# Patient Record
Sex: Male | Born: 1940 | Race: White | Hispanic: No | Smoking: Former smoker
Health system: Southern US, Community
[De-identification: ages and names within clinical notes are randomized; demographics above are authoritative.]

## PROBLEM LIST (undated history)

## (undated) DIAGNOSIS — I2510 Atherosclerotic heart disease of native coronary artery without angina pectoris: Secondary | ICD-10-CM

## (undated) DIAGNOSIS — J449 Chronic obstructive pulmonary disease, unspecified: Secondary | ICD-10-CM

## (undated) DIAGNOSIS — E119 Type 2 diabetes mellitus without complications: Secondary | ICD-10-CM

## (undated) DIAGNOSIS — I4891 Unspecified atrial fibrillation: Secondary | ICD-10-CM

---

## 2007-05-30 ENCOUNTER — Encounter: Payer: Self-pay | Admitting: Physical Medicine and Rehabilitation

## 2007-06-17 ENCOUNTER — Encounter: Payer: Self-pay | Admitting: Physical Medicine and Rehabilitation

## 2012-11-12 ENCOUNTER — Inpatient Hospital Stay: Payer: Self-pay | Admitting: Internal Medicine

## 2012-11-12 LAB — CBC WITH DIFFERENTIAL/PLATELET
HCT: 45.8 % (ref 40.0–52.0)
HGB: 15.7 g/dL (ref 13.0–18.0)
Lymphocyte #: 1.6 10*3/uL (ref 1.0–3.6)
MCV: 89 fL (ref 80–100)
Monocyte #: 0.8 x10 3/mm (ref 0.2–1.0)
Monocyte %: 6.9 %
Neutrophil #: 8.4 10*3/uL — ABNORMAL HIGH (ref 1.4–6.5)
Neutrophil %: 76.7 %
Platelet: 208 10*3/uL (ref 150–440)
RBC: 5.13 10*6/uL (ref 4.40–5.90)
WBC: 11 10*3/uL — ABNORMAL HIGH (ref 3.8–10.6)

## 2012-11-12 LAB — URINALYSIS, COMPLETE
Bilirubin,UR: NEGATIVE
Glucose,UR: >=500 mg/dL (ref 0–75)
Leukocyte Esterase: NEGATIVE
Ph: 5 (ref 4.5–8.0)
Protein: 30
RBC,UR: 1 /HPF (ref 0–5)
Specific Gravity: 1.03 (ref 1.003–1.030)
Squamous Epithelial: NONE SEEN
WBC UR: 1 /HPF (ref 0–5)

## 2012-11-12 LAB — BASIC METABOLIC PANEL
Anion Gap: 6 — ABNORMAL LOW (ref 7–16)
Calcium, Total: 9.6 mg/dL (ref 8.5–10.1)
Chloride: 99 mmol/L (ref 98–107)
Co2: 30 mmol/L (ref 21–32)
Creatinine: 0.77 mg/dL (ref 0.60–1.30)
EGFR (African American): >60
Osmolality: 278 (ref 275–301)
Potassium: 4 mmol/L (ref 3.5–5.1)
Sodium: 135 mmol/L — ABNORMAL LOW (ref 136–145)

## 2012-11-12 LAB — PROTIME-INR: INR: 1

## 2012-11-13 LAB — CBC WITH DIFFERENTIAL/PLATELET
Basophil #: 0.1 10*3/uL (ref 0.0–0.1)
Basophil %: 0.6 %
Eosinophil #: 0 10*3/uL (ref 0.0–0.7)
Eosinophil %: 0.3 %
HCT: 43.4 % (ref 40.0–52.0)
HGB: 14.5 g/dL (ref 13.0–18.0)
Lymphocyte #: 2.3 10*3/uL (ref 1.0–3.6)
Lymphocyte %: 15.1 %
MCV: 90 fL (ref 80–100)
Monocyte #: 1.5 x10 3/mm — ABNORMAL HIGH (ref 0.2–1.0)
Monocyte %: 10.2 %
Neutrophil %: 73.8 %
RBC: 4.84 10*6/uL (ref 4.40–5.90)
RDW: 14 % (ref 11.5–14.5)

## 2012-11-13 LAB — BASIC METABOLIC PANEL
Anion Gap: 6 — ABNORMAL LOW (ref 7–16)
Chloride: 100 mmol/L (ref 98–107)
Co2: 29 mmol/L (ref 21–32)
Creatinine: 0.73 mg/dL (ref 0.60–1.30)
Potassium: 3.9 mmol/L (ref 3.5–5.1)
Sodium: 135 mmol/L — ABNORMAL LOW (ref 136–145)

## 2012-11-14 LAB — CBC WITH DIFFERENTIAL/PLATELET
Eosinophil %: 0.1 %
HCT: 38.5 % — ABNORMAL LOW (ref 40.0–52.0)
HGB: 13.1 g/dL (ref 13.0–18.0)
Lymphocyte #: 2.1 10*3/uL (ref 1.0–3.6)
Lymphocyte %: 12.4 %
MCH: 30.5 pg (ref 26.0–34.0)
MCHC: 34 g/dL (ref 32.0–36.0)
MCV: 90 fL (ref 80–100)
Neutrophil #: 12.5 10*3/uL — ABNORMAL HIGH (ref 1.4–6.5)
Platelet: 183 10*3/uL (ref 150–440)

## 2012-11-14 LAB — BASIC METABOLIC PANEL
Anion Gap: 4 — ABNORMAL LOW (ref 7–16)
BUN: 15 mg/dL (ref 7–18)
Calcium, Total: 8.8 mg/dL (ref 8.5–10.1)
Chloride: 101 mmol/L (ref 98–107)
EGFR (Non-African Amer.): >60
Osmolality: 276 (ref 275–301)
Potassium: 4.3 mmol/L (ref 3.5–5.1)
Sodium: 135 mmol/L — ABNORMAL LOW (ref 136–145)

## 2012-11-21 ENCOUNTER — Other Ambulatory Visit: Payer: Self-pay | Admitting: Family Medicine

## 2012-11-21 LAB — CBC WITH DIFFERENTIAL/PLATELET
Basophil #: 0.1 10*3/uL (ref 0.0–0.1)
Eosinophil #: 0.1 10*3/uL (ref 0.0–0.7)
Eosinophil %: 0.7 %
Lymphocyte %: 7.1 %
MCH: 30 pg (ref 26.0–34.0)
MCHC: 33.8 g/dL (ref 32.0–36.0)
Monocyte #: 1.5 x10 3/mm — ABNORMAL HIGH (ref 0.2–1.0)
Platelet: 272 10*3/uL (ref 150–440)
RBC: 3.77 10*6/uL — ABNORMAL LOW (ref 4.40–5.90)
RDW: 13.9 % (ref 11.5–14.5)
WBC: 21.9 10*3/uL — ABNORMAL HIGH (ref 3.8–10.6)

## 2012-11-21 LAB — URINALYSIS, COMPLETE
Blood: NEGATIVE
Glucose,UR: >=500 mg/dL (ref 0–75)
Nitrite: NEGATIVE
Ph: 5 (ref 4.5–8.0)
Protein: NEGATIVE
RBC,UR: 1 /HPF (ref 0–5)
Specific Gravity: 1.035 (ref 1.003–1.030)
Squamous Epithelial: <1
WBC UR: 2 /HPF (ref 0–5)

## 2012-11-21 LAB — COMPREHENSIVE METABOLIC PANEL
Albumin: 2.8 g/dL — ABNORMAL LOW (ref 3.4–5.0)
Anion Gap: 7 (ref 7–16)
BUN: 18 mg/dL (ref 7–18)
Bilirubin,Total: 0.4 mg/dL (ref 0.2–1.0)
Chloride: 93 mmol/L — ABNORMAL LOW (ref 98–107)
Co2: 30 mmol/L (ref 21–32)
EGFR (African American): >60
EGFR (Non-African Amer.): >60
Glucose: 189 mg/dL — ABNORMAL HIGH (ref 65–99)

## 2012-11-23 LAB — URINE CULTURE

## 2012-12-03 ENCOUNTER — Other Ambulatory Visit: Payer: Self-pay

## 2012-12-03 LAB — URINALYSIS, COMPLETE
Bacteria: NONE SEEN
Blood: NEGATIVE
Glucose,UR: >=500 mg/dL (ref 0–75)
Ketone: NEGATIVE
RBC,UR: 2 /HPF (ref 0–5)
Specific Gravity: 1.039 (ref 1.003–1.030)
Squamous Epithelial: <1
WBC UR: 3 /HPF (ref 0–5)

## 2012-12-03 LAB — CBC WITH DIFFERENTIAL/PLATELET
Basophil %: 0.9 %
Eosinophil #: 0.2 10*3/uL (ref 0.0–0.7)
Eosinophil %: 1 %
HCT: 34.6 % — ABNORMAL LOW (ref 40.0–52.0)
HGB: 11.7 g/dL — ABNORMAL LOW (ref 13.0–18.0)
MCH: 29.4 pg (ref 26.0–34.0)
MCHC: 33.7 g/dL (ref 32.0–36.0)
Monocyte %: 4.7 %
Neutrophil #: 17.8 10*3/uL — ABNORMAL HIGH (ref 1.4–6.5)
Neutrophil %: 83.6 %
RDW: 14.3 % (ref 11.5–14.5)
WBC: 21.3 10*3/uL — ABNORMAL HIGH (ref 3.8–10.6)

## 2012-12-03 LAB — COMPREHENSIVE METABOLIC PANEL
Albumin: 2.4 g/dL — ABNORMAL LOW (ref 3.4–5.0)
Alkaline Phosphatase: 140 U/L — ABNORMAL HIGH (ref 50–136)
BUN: 14 mg/dL (ref 7–18)
Calcium, Total: 8.3 mg/dL — ABNORMAL LOW (ref 8.5–10.1)
Chloride: 97 mmol/L — ABNORMAL LOW (ref 98–107)
Co2: 30 mmol/L (ref 21–32)
Creatinine: 0.64 mg/dL (ref 0.60–1.30)
EGFR (African American): >60
EGFR (Non-African Amer.): >60
Glucose: 210 mg/dL — ABNORMAL HIGH (ref 65–99)
Potassium: 4.4 mmol/L (ref 3.5–5.1)
SGPT (ALT): 20 U/L (ref 12–78)

## 2012-12-03 LAB — SEDIMENTATION RATE: Erythrocyte Sed Rate: 5 mm/hr (ref 0–20)

## 2012-12-05 ENCOUNTER — Other Ambulatory Visit: Payer: Self-pay | Admitting: Family Medicine

## 2012-12-05 LAB — URINE CULTURE

## 2012-12-17 ENCOUNTER — Other Ambulatory Visit: Payer: Self-pay | Admitting: Family Medicine

## 2012-12-17 LAB — CLOSTRIDIUM DIFFICILE(ARMC)

## 2013-05-14 ENCOUNTER — Ambulatory Visit: Payer: Self-pay | Admitting: Orthopedic Surgery

## 2013-06-02 ENCOUNTER — Ambulatory Visit: Payer: Self-pay | Admitting: Orthopedic Surgery

## 2014-05-08 NOTE — Op Note (Signed)
PATIENT NAME:  Craig Nash, Craig Nash MR#:  [id number] DATE OF BIRTH:  [birth year]  DATE OF PROCEDURE:  11/13/2012  PREOPERATIVE DIAGNOSIS:  Right nondisplaced intertrochanteric hip fracture.   POSTOPERATIVE DIAGNOSIS:  Right nondisplaced intertrochanteric hip fracture.  PROCEDURE:  Open reduction and internal fixation of right intertrochanteric hip fracture.   ANESTHESIA:  Spinal.   SURGEON:  Juanell FairlyKevin Dung Prien, M.D.   ASSISTANT:  Arther AbbottJenni Burnett, [employer] PA student.   ESTIMATED BLOOD LOSS:  100 mL.   COMPLICATIONS:  None.   IMPLANTS:  Synthes 4-holed DHS plate with 045100 mm lag screw and four bicortical screws.   INDICATIONS FOR PROCEDURE:  The patient is a 74 year old male who sustained a mechanical fall at home.  He was unable to stand or bear weight following this injury.  I have recommended plate fixation for this fracture.  I reviewed the risks and benefits of surgery with the patient.  He understands the risks and wished to proceed.   PROCEDURE NOTE:  The patient was marked with the word yes over the right hip according to the hospital's right site protocol.  He was brought to the Operating Room where he underwent placement of a spinal anesthetic by the anesthesia service.  He was then placed supine on the fracture table.  His right leg was placed in a legholder and his left leg was placed in a hemi- lithotomy position.  All bony prominences were adequately padded, since this was a nondisplaced fracture the patient did not require any traction or significant manipulation.   The proposed incision was drawn out with a surgical marker based upon C-arm images taken prior to the incision.  A lateral incision was made based over just inferior to the greater trochanter along the lateral thigh.  The initial incision was made with a #10 blade.  The subcutaneous tissues were then dissected with electrocautery.  All bleeding vessels were cauterized during the exposure.  The fascia lata was then identified and  cleared of overlying adipose tissue with a Art therapistKey elevator.  A deep #10 blade was used to sharply incise the fascia lata.  The vastus lateralis muscle was then bluntly split in line with its fibers to reveal the lateral cortex of the femur.  A 130 degree drill guide was placed along the lateral femur.  A drill pin was advanced across the fracture site and into the femoral head.  A tip of apex distance of less than 25 mm was achieved and the drill pin placement was confirmed on AP and lateral C-arm images.  A depth gauge was used to measure the lag screw length which was 100 mm.   The cannulated lag screw drill was then placed over the guidepin and advanced to a depth of 100 mm.  The cannulated tap was then advanced into the femoral head and backed out.  This was performed by hand.  A 130 degree 4-hole Synthes DHS plate with 409100 mm lag screw construct was then assembled on the back table.  The lag screw was advanced using a centralizer to a depth of 100 mm.  Its position was confirmed on AP and lateral C-arm images.  The centralizer was then removed.  The 4-hole plate was then advanced into position along the lateral femur.  Again, the position of the lag screw and plate was confirmed on AP and lateral C-arm images.  Four bicortical screws were then placed through the Synthes plate to stabilize it to the lateral femur.  The depth of the bicortical screws  was measured using a depth gauge.  Once all four screws were in position, final images were taken in both the AP and lateral plane of the fixation construct.  The wounds were then copiously irrigated with GU impregnated saline.  The vastus lateralis muscle was reapproximated.  The fascia lata was closed with interrupted 0 Vicryl suture in a figure-of-eight pattern.  The fascia lata was then copiously irrigated again with bulb irrigation.  The subcutaneous tissue was closed with 2-0 Vicryl and the skin approximated by staples.  A dry sterile dressing was applied.  The  patient was then awoken and transferred to the hospital bed in stable condition.  I was scrubbed and present for the entire case and all sharp and instrument counts were correct at the conclusion of the case.  I spoke with the patient's girlfriend in the postop consultation room to let her know that the patient was stable in recovery and the case had gone without complication.      ____________________________ Kathreen Devoid, MD klk:ea D: 11/13/2012 23:16:39 ET T: 11/13/2012 23:36:10 ET JOB#: [id number]  cc: Kathreen Devoid, MD, <Dictator> Kathreen Devoid MD ELECTRONICALLY SIGNED 12/09/2012 19:22

## 2014-05-08 NOTE — H&P (Signed)
PATIENT NAME:  Craig Nash, Craig Nash MR#:  [id number] DATE OF BIRTH:  06-05-1940  DATE OF ADMISSION:  11/12/2012  PRIMARY CARE PROVIDER: At [facility]  EMERGENCY DEPARTMENT REFERRING PHYSICIAN: Dr. Ethelda Chick.  REASON FOR ADMISSION: Hip fracture.   HISTORY OF PRESENT ILLNESS: The patient is a 74 year old Caucasian male with history of chronic respiratory failure on 2 to 3 liters of oxygen and has a history of COPD, hypertension, previous history of CVA, GERD, diabetes, who has chronic unsteadiness, who was trying to walk, and then he tried to step outside the door and lost balance and fell. Now has a right hip fracture. The patient has chronic respiratory failure, which is unchanged. He uses a O2 at all times 2 to 3 liters. This has not changed. He has not had any fevers or chills. Has not been coughing or wheezing. He also has a history of coronary artery disease and denies any recent chest pain or palpitations. Denies any lower extremity swelling or any nocturnal dyspnea. He does have very poor circulation with peripheral vascular disease. 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 According to his wife his diabetes is under poor control. He otherwise denies any abdominal pain, nausea, vomiting, diarrhea, or any urinary symptoms.   PAST MEDICAL HISTORY: Significant for:  1. COPD on 2 to 3 liters of oxygen at all times.  2. Hypertension.  3. 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  4. GERD. 5. Hyperlipidemia.  6. Diabetes type 2 insulin requiring.  7. 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   PAST SURGICAL HISTORY: Status post ERCP, CABG, appendectomy, hernia repair.   ALLERGIES: None.   CURRENT MEDICATIONS: He is on aspirin 81 one tab p.o. daily, acetaminophen hydrocodone 325/7.5 one tab p.o. q.8 p.r.n., atorvastatin 80 at bedtime, Colace 100, 1 tab p.o. b.i.d., Humalog 8 units t.i.d. prior to each meal, Lantus  55 units at bedtime, Reglan 10 mg 1 tab 4 times a day prior to each meal and metoprolol 12.5 mg 1 tab p.o. b.i.d., omeprazole 20 daily, paroxetine 40 daily, Ramipril 5 mg 1 tab p.o. b.i.d., Spiriva inhalation 18 mcg daily.   SOCIAL HISTORY: Does not smoke. Does not drink. No drugs. Lives with his wife.   FAMILY HISTORY: Positive for hypertension.   REVIEW OF SYSTEMS:  CONSTITUTIONAL: Denies any fever, has chronic fatigue, weakness. Complains of pain in his leg. No weight loss. No weight gain.  EYES: No blurred or double vision. No pain. No redness. No inflammation. Has history of cataracts that have been operated on.  ENT: No tinnitus. No ear pain. No hearing loss. No seasonal or year-round allergies. No epistaxis. No nasal discharge. No snoring. No postnasal drip. No sinus pain. No difficulty swallowing.  RESPIRATORY: Denies any cough, wheezing, hemoptysis. Has chronic dyspnea. Has COPD.  CARDIOVASCULAR: Denies any chest pain, orthopnea, edema. No arrhythmia. Has dyspnea on exertion. No palpitations. No syncope.  GASTROINTESTINAL: No nausea, vomiting, diarrhea. No abdominal pain. No hematemesis. No melena. No ulcer. No GERD. No IBS. No jaundice.  GENITOURINARY: Denies any dysuria, hematuria, renal calculus or frequency.  ENDOCRINE: Denies any polyuria, nocturia or thyroid problems.  HEMATOLOGIC AND LYMPHATIC: Denies any major bruisability or bleeding.  SKIN: No acne. No rash. No changes in mole, hair or skin.  MUSCULOSKELETAL: Complains of pain in his leg. No gout.  NEUROLOGICAL: History of previous CVA. No seizures.  PSYCHIATRIC: No anxiety. No insomnia. No  ADD.   PHYSICAL EXAMINATION: VITAL SIGNS: Temperature 99.2, pulse 93, respirations 19, blood pressure 145/83, 02 90 on room air.  GENERAL: The patient is a chronically ill-appearing male. Currently not in any acute distress.  HEENT: Head atraumatic, normocephalic. Pupils equally round, reactive to light and accommodation. There is no  conjunctival pallor. No scleral icterus. Extraocular movements intact. Nasal exam shows no nasal lesions or drainage.  EARS: There is no drainage or external lesions.  MOUTH: No exudates. No lesions.   NECK: Supple and symmetric. No masses. Thyroid midline, not enlarged. No JVD.  CARDIOVASCULAR: Regular rate and rhythm. No murmurs, gallops, clicks or heaves.  RESPIRATORY: Good respiratory effort. Diminished breath sounds bilaterally without any rales, rhonchi, wheezing.  ABDOMEN: Soft, nontender, nondistended. Positive bowel sounds x 4. No hepatosplenomegaly.  GENITOURINARY: Deferred.  MUSCULOSKELETAL: There is no erythema or swelling.  SKIN: No rash.  LYMPHATICS: No lymph nodes palpable.  VASCULAR: Diminished DP, PT pulses.  NEUROLOGICAL: Cranial nerves II through XII grossly appear intact. The patient has generalized weakness.   EVALUATIONS: EKG shows possible old anterior infarct. No ST-T wave changes. WBC 11.0, hemoglobin 15.7, platelet count 208. The rest of his blood work is pending.   ASSESSMENT AND PLAN: The patient is a 74 year old white male with multiple medical problems status post fall with hip fracture.  1. Hip fracture. In light of his significant respiratory failure and coronary artery disease the patient is considered, moderate to high risk of surgical complications. However, he will need his hip operated on, otherwise he will be bedridden for a prolonged time, which will likely lead to worse outcome. At this time recommend no further work-up. It is okay to proceed to the OR. The patient to discuss further risks and benefits with Ortho. We will monitor him on telemetry and Ortho floor.  2. Chronic obstructive pulmonary disease without evidence of acute chronic obstructive pulmonary disease exacerbation. He does have chronic respiratory failure. We will continue 02 at 2 to 3 liters as taking at home. We will continue Spiriva. I will add Advair to his current regimen as well as  p.r.n. nebulizers.  3. Diabetes on Lantus high-dose with premeal insulin. At this time, we will place him on lower dose Levemir since Lantus is not carried at the hospital. We will monitor his blood sugar, adjust his Levemir dose as per his sugars. Once his surgery is complete he will likely need to have his premeal insulin restarted. 4. Hypertension. We will hold his ramipril but continue metoprolol.  5. Coronary artery disease. Hold aspirin and resume as soon as possible. Continue metoprolol.  6. Hyperlipidemia. Continue atorvastatin.  7. Miscellaneous: Recommend incentive spirometry postoperative as well as early ambulation and deep vein thrombosis prophylaxis.   TIME SPENT: 50 minutes spent on this patient.    ____________________________ Lacie ScottsShreyang H. Allena KatzPatel, MD shp:sg D: 11/12/2012 18:17:00 ET T: 11/12/2012 18:24:43 ET JOB#: [id number]  cc: Madelynn Malson H. Allena KatzPatel, MD, <Dictator> Charise CarwinSHREYANG H Geniva Lohnes MD ELECTRONICALLY SIGNED 11/15/2012 8:32

## 2014-05-08 NOTE — Discharge Summary (Signed)
PATIENT NAME:  Craig Nash, Craig Nash MR#:  [id number] DATE OF BIRTH:  01-10-1941  DATE OF ADMISSION:  11/12/2012 DATE OF DISCHARGE:  11/16/2012   PRESENTING COMPLAINT: Fall with right hip pain.  DISCHARGE DIAGNOSES: 1. Right intertrochanteric hip fracture status post open reduction and internal fixation by Dr.  Martha ClanKrasinski on October 29th.  2. Chronic obstructive pulmonary disease, on 3 liters nasal cannula oxygen.  3. Type 2 diabetes.  4. Hyperlipidemia.  5. Hypertension.  6. Coronary artery disease.   CONDITION ON DISCHARGE: Fair. Vitals stable. Saturations are 94% on 2.5 liters. Blood pressure 113/75, pulse is 98.  CODE STATUS: Full code.   LABORATORY DATA AT DISCHARGE: Glucose 220. H and H is 13.1 and 38.5, white count is 16.7. BUN is 15, creatinine is 0.7, sodium is 135, potassium 4.3. UA negative for UTI.   IMAGING: Chest x-ray on admission: Borderline cardiomegaly, prior CABG. Right hip shows right intertrochanteric hip fracture.   CONSULTATION: Orthopedic consultation by Kathreen DevoidKevin L. Krasinski, MD  FOLLOWUP: With Dr. Martha ClanKrasinski in 2 weeks.   DISCHARGE INSTRUCTIONS: Physical therapy. Oxygen 2 liters per minute continuous to keep sats greater than 92%. Physical therapy, speech therapy to follow.   DIET: 2 grams sodium, 1800 calorie ADA diet.   MEDICATIONS:  1. Paxil 40 mg daily.  2. Metoclopramide 10 mg q.i.d.  3. Atorvastatin 80 mg at bedtime.  4. Lopressor 12.5 b.i.d.  5. Spiriva 1 capsule inhalation daily.  6. Sliding scale insulin.  7. Insulin detemir 30 units q.24.  8. Ramipril 5 mg b.i.d.  9. Cyclobenzaprine 5 mg q.8 p.r.n.  10. Aspirin 325 mg p.o. daily.  11. Bisacodyl 10 mg rectal at bedtime p.r.n.  12. Norco 5/325 mg 1 to 2 q.4-6 p.r.n.  13. Omeprazole 20 mg daily.  14. Fosamax 70 mg p.o. weekly.  15. Tylenol 650 q.4 p.r.n.  16. Calcium carbonate with vitamin D 1 tablet b.i.d.  17. Docusate 240 mg at bedtime.  18. Ferrous sulfate 325 b.i.d.   BRIEF SUMMARY OF  HOSPITAL COURSE: Craig Nash is a 74 year old Caucasian gentleman with history of COPD, on home oxygen, hypertension, diabetes and CAD, who comes to the Emergency Room after he had sustained a mechanical fall with right hip fracture. He was admitted with:   1. Acute right intertrochanteric hip fracture. He was seen by Dr. Martha ClanKrasinski. He was medically cleared and underwent open reduction and internal fixation on the 29th of October. Postoperatively, the patient did well. He tolerated surgery well. His incentive spirometry was continued along with his oxygen and nebulizers and inhalers. The patient will get aspirin 325 mg p.o. daily for deep vein thrombosis prophylaxis per Dr. Reita Chardhris Smith, who was covering for Dr. Martha ClanKrasinski over the weekend. He received Lovenox while he was in the hospital.  2. Chronic obstructive pulmonary disease, on home oxygen, without acute flare. Continue Spiriva and p.r.n. nebulizers.  3. Type 2 diabetes, on insulin detemir, with premeal insulin. He is going to be discharged with Levemir.  4. Hypertension, on ramipril and metoprolol.  5. Coronary artery disease. Resume aspirin.  6. Hyperlipidemia. Continue atorvastatin.  7. Deep vein thrombosis prophylaxis. Was on 30 mg Lovenox subcutaneous b.i.d.; however, per Dr. Reita Chardhris Smith, give aspirin 325 daily at discharge.  8. Speech therapy to see the patient to evaluate swallowing evaluation prior to discharge.  9. Hospital stay otherwise remained stable.   CODE STATUS: The patient remained a full code.   TIME SPENT: 40 minutes.   ____________________________ Wylie HailSona A. Allena KatzPatel, MD sap:lb  D: 11/16/2012 09:10:28 ET T: 11/16/2012 09:32:11 ET JOB#: [id number]  cc: Hiram Mciver A. Allena Katz, MD, <Dictator> Kathreen Devoid, MD Willow Ora MD ELECTRONICALLY SIGNED 11/21/2012 13:27

## 2014-05-08 NOTE — Consult Note (Signed)
Brief Consult Note: Diagnosis: Right non-displaced intertrochanteric hip fracture.   Patient was seen by consultant.   Consult note dictated.   Recommend to proceed with surgery or procedure.   Orders entered.   Comments: Patient is scheduled for surgery later today.  I have reviewed all radiographs and lab tests as well as the admission H&P from Dr. Allena KatzPatel, hospitalist.  I have reviewed the risks and benefits of surgery and blood transfusion with the patient.  He has agreed to surgery.  I called his daughter but was unable to reach her.  Electronic Signatures: Juanell FairlyKrasinski, Clairessa Boulet (MD)  (Signed 29-Oct-14 09:15)  Authored: Brief Consult Note   Last Updated: 29-Oct-14 09:15 by Juanell FairlyKrasinski, Shenice Dolder (MD)

## 2014-05-08 NOTE — Consult Note (Signed)
PATIENT NAME:  Craig Nash, Craig Nash MR#:  [id number] DATE OF BIRTH:  March 14, 1940  DATE OF CONSULTATION:  11/13/2012  REFERRING PHYSICIAN:   CONSULTING PHYSICIAN:  Kathreen Devoid, MD  REASON FOR CONSULTATION: Right intertrochanteric hip fracture.   HISTORY OF PRESENT ILLNESS: Craig Nash is a 74 year old male with chronic COPD with a 2 to 3 liter home O2 requirement as well as a history of CVA, GERD and diabetes. The patient has chronic issues with balance and fell trying to walk outside of his house. He landed on his right side and was unable to stand or ambulate following this injury. He was brought to [facility] where he was diagnosed with a nondisplaced intertrochanteric hip fracture. He was admitted to the hospitalist service for preop clearance. I am consulted for management of the intertrochanteric hip fracture.   PAST MEDICAL HISTORY: Includes COPD, hypertension, previous CVA with left-sided weakness and an unsteady gait, GERD, hyperlipidemia, type 2 diabetes requiring insulin and coronary artery disease status post CABG 14 years ago.   PAST SURGICAL HISTORY: Includes ERCP, CABG, appendectomy and a hernia repair.   ALLERGIES TO MEDICATIONS: None.   CURRENT MEDICATIONS: Aspirin 81 mg daily, Norco 1 tablet p.o. q.8 hours p.r.n., atorvastatin 80 mg at bedtime, Colace 100 mg b.i.d., Humalog 8 units t.i.d. prior to each meal, Lantus 55 units at bedtime, Reglan 10 mg 1 tab q.i.d., metoprolol 12.5 mg 1 tablet b.i.d., omeprazole 20 mg daily, paroxetine 40 mg daily, ramipril 5 mg b.i.d. and Spiriva inhaler 18 mcg daily.   SOCIAL HISTORY: The patient does not smoke, use drugs or drink alcohol. He lives with his girlfriend.   PHYSICAL EXAMINATION: RIGHT HIP: The patient's skin is intact. He has no erythema, ecchymosis or significant swelling. His thigh compartments are soft and compressible. He has intact sensation to light touch throughout the right lower extremity with palpable pedal  pulses. He has intact motor function throughout the right lower extremity as well.   RADIOLOGY: X-ray films of the pelvis and right hip were reviewed. This demonstrates a fracture of the right hip. This was nondisplaced.   ASSESSMENT: Nondisplaced right intertrochanteric hip fracture.   PLAN: I explained to Craig Nash the nature of his fracture. He understands that he will be unable to get out of bed, stand or walk with this injury if he does not have this surgically fixed. I am recommending an open reduction and internal fixation. I reviewed the details of this operation along with the risks and benefits. He understands the risks include, but are not limited to, infection, bleeding, nerve or blood vessel injury, malunion, nonunion, failure of the hardware, screw cut out, persistent right hip pain, change in leg lengths or change in lower extremity rotation and the need for further surgery. Medical complications include, but are not limited to, DVT and pulmonary embolism, myocardial infarction, stroke, pneumonia, respiratory failure and death. The patient understood these risks and wished to proceed with surgery. Consent was signed this morning at his bedside along with a consent for blood transfusion if necessary. He understands the risks of a transfusion include possible disease transmission including hepatitis C, HIV or bacterial infection.   I have reviewed all of the patient's labs and radiographic studies in preparation for his case.   The patient's right hip was signed with the word yes according to the hospital's right side protocol. He will remain n.p.o. today and is scheduled for surgery later this morning. I answered all of the patient's questions.  ____________________________ Kathreen DevoidKevin L. Markitta Ausburn, MD klk:gb D: 11/13/2012 23:09:43 ET T: 11/13/2012 23:21:20 ET JOB#: [id number]  cc: Kathreen DevoidKevin L. Deborah Lazcano, MD, <Dictator> Kathreen DevoidKEVIN L Cristy Colmenares MD ELECTRONICALLY SIGNED 12/09/2012 19:22

## 2015-07-04 ENCOUNTER — Emergency Department: Payer: Medicare Other

## 2015-07-04 ENCOUNTER — Other Ambulatory Visit: Payer: Self-pay

## 2015-07-04 ENCOUNTER — Encounter: Payer: Self-pay | Admitting: *Deleted

## 2015-07-04 ENCOUNTER — Emergency Department
Admission: EM | Admit: 2015-07-04 | Discharge: 2015-07-04 | Disposition: A | Discharge status: Other Acute Inpatient Hospital | Payer: Medicare Other | Attending: Emergency Medicine | Admitting: Emergency Medicine

## 2015-07-04 DIAGNOSIS — Y939 Activity, unspecified: Secondary | ICD-10-CM | POA: Insufficient documentation

## 2015-07-04 DIAGNOSIS — J449 Chronic obstructive pulmonary disease, unspecified: Secondary | ICD-10-CM | POA: Diagnosis not present

## 2015-07-04 DIAGNOSIS — S12000A Unspecified displaced fracture of first cervical vertebra, initial encounter for closed fracture: Secondary | ICD-10-CM

## 2015-07-04 DIAGNOSIS — S22029A Unspecified fracture of second thoracic vertebra, initial encounter for closed fracture: Secondary | ICD-10-CM | POA: Insufficient documentation

## 2015-07-04 DIAGNOSIS — I2510 Atherosclerotic heart disease of native coronary artery without angina pectoris: Secondary | ICD-10-CM | POA: Diagnosis not present

## 2015-07-04 DIAGNOSIS — W1839XA Other fall on same level, initial encounter: Secondary | ICD-10-CM | POA: Diagnosis not present

## 2015-07-04 DIAGNOSIS — Z87891 Personal history of nicotine dependence: Secondary | ICD-10-CM | POA: Insufficient documentation

## 2015-07-04 DIAGNOSIS — E119 Type 2 diabetes mellitus without complications: Secondary | ICD-10-CM | POA: Insufficient documentation

## 2015-07-04 DIAGNOSIS — Y929 Unspecified place or not applicable: Secondary | ICD-10-CM | POA: Diagnosis not present

## 2015-07-04 DIAGNOSIS — S0990XA Unspecified injury of head, initial encounter: Secondary | ICD-10-CM | POA: Diagnosis present

## 2015-07-04 DIAGNOSIS — Z66 Do not resuscitate: Secondary | ICD-10-CM | POA: Diagnosis not present

## 2015-07-04 DIAGNOSIS — I4891 Unspecified atrial fibrillation: Secondary | ICD-10-CM | POA: Diagnosis not present

## 2015-07-04 DIAGNOSIS — Y999 Unspecified external cause status: Secondary | ICD-10-CM | POA: Diagnosis not present

## 2015-07-04 DIAGNOSIS — S12100A Unspecified displaced fracture of second cervical vertebra, initial encounter for closed fracture: Secondary | ICD-10-CM | POA: Diagnosis not present

## 2015-07-04 HISTORY — DX: Type 2 diabetes mellitus without complications: E11.9

## 2015-07-04 HISTORY — DX: Unspecified atrial fibrillation: I48.91

## 2015-07-04 HISTORY — DX: Chronic obstructive pulmonary disease, unspecified: J44.9

## 2015-07-04 HISTORY — DX: Atherosclerotic heart disease of native coronary artery without angina pectoris: I25.10

## 2015-07-04 LAB — CBC WITH DIFFERENTIAL/PLATELET
Basophils Absolute: 0.1 10*3/uL (ref 0–0.1)
Eosinophils Absolute: 0.1 10*3/uL (ref 0–0.7)
Eosinophils Relative: 1 %
HCT: 36.9 % — ABNORMAL LOW (ref 40.0–52.0)
HEMOGLOBIN: 12.1 g/dL — AB (ref 13.0–18.0)
Lymphocytes Relative: 9 %
Lymphs Abs: 1.7 10*3/uL (ref 1.0–3.6)
MCH: 28.3 pg (ref 26.0–34.0)
MCHC: 32.9 g/dL (ref 32.0–36.0)
MCV: 86.2 fL (ref 80.0–100.0)
Monocytes Absolute: 0.9 10*3/uL (ref 0.2–1.0)
Neutro Abs: 15.8 10*3/uL — ABNORMAL HIGH (ref 1.4–6.5)
Platelets: 261 10*3/uL (ref 150–440)
RBC: 4.28 MIL/uL — ABNORMAL LOW (ref 4.40–5.90)
RDW: 15.3 % — ABNORMAL HIGH (ref 11.5–14.5)
WBC: 18.7 10*3/uL — AB (ref 3.8–10.6)

## 2015-07-04 LAB — COMPREHENSIVE METABOLIC PANEL
ALBUMIN: 3.4 g/dL — AB (ref 3.5–5.0)
ALK PHOS: 66 U/L (ref 38–126)
ALT: 17 U/L (ref 17–63)
ANION GAP: 10 (ref 5–15)
AST: 22 U/L (ref 15–41)
BUN: 12 mg/dL (ref 6–20)
CHLORIDE: 93 mmol/L — AB (ref 101–111)
CO2: 28 mmol/L (ref 22–32)
Calcium: 8.6 mg/dL — ABNORMAL LOW (ref 8.9–10.3)
Creatinine, Ser: 0.63 mg/dL (ref 0.61–1.24)
GFR calc Af Amer: >60 mL/min (ref >60)
GFR calc non Af Amer: >60 mL/min (ref >60)
GLUCOSE: 249 mg/dL — AB (ref 65–99)
POTASSIUM: 3.8 mmol/L (ref 3.5–5.1)
SODIUM: 131 mmol/L — AB (ref 135–145)
Total Bilirubin: 0.6 mg/dL (ref 0.3–1.2)
Total Protein: 7.6 g/dL (ref 6.5–8.1)

## 2015-07-04 LAB — TROPONIN I: Troponin I: 0.11 ng/mL — ABNORMAL HIGH (ref <0.031)

## 2015-07-04 LAB — GLUCOSE, CAPILLARY: Glucose-Capillary: 249 mg/dL — ABNORMAL HIGH (ref 65–99)

## 2015-07-04 LAB — LIPASE, BLOOD: Lipase: 14 U/L (ref 11–51)

## 2015-07-04 MED ORDER — MORPHINE SULFATE (PF) 4 MG/ML IV SOLN
4.0000 mg | Freq: Once | INTRAVENOUS | Status: AC
  Administered 2015-07-04: 4 mg via INTRAVENOUS
  Filled 2015-07-04: qty 1

## 2015-07-04 NOTE — ED Notes (Addendum)
Pt to ED from [facility] after slipping and falling while urinating today. Pt fell forward and hit head, no LOC. Pt currently on Plavix. Pt hospice pt, end stage COPD. Pt AAOx3 on arrival, baseline per EMS. Pt with large hematoma to front of head, c/c of HA 10/10. Vitals stable, pt home 02 dependant 2L [place]. Pt pale in appearence.

## 2015-07-04 NOTE — ED Notes (Addendum)
C collar placed at this time by RN. Aligned and centered properly. Pt laid flat, pt denies any numbness or tingling in bilateral lower extremities. Call bell placed in pt's hand for any further needs.

## 2015-07-04 NOTE — ED Notes (Addendum)
Pt becoming pale and diaphoretic, dropping sats to 80% on 4L [place]. Pt placed on non re-breather at this time, 10L. MD York CeriseForbach made aware and verbalized understanding, no further orders given at this time.

## 2015-07-04 NOTE — ED Notes (Signed)
[facility], given to Ed, RN who verbalized understanding.

## 2015-07-04 NOTE — ED Provider Notes (Signed)
[facility] Emergency Department Provider Note ____________________________________________  Time seen: Approximately 4:26 PM  I have reviewed the triage vital signs and the nursing notes.   HISTORY  Chief Complaint Fall and Head Injury    HPI Craig Nash is a 75 y.o. 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  He arrives by EMS for evaluation after a fall.  Reportedlyhe was up to urinate and fell forwards, striking his forehead on the floor from a standing position.  He complains of headache and pain in his upper neck.  He denies numbness and tingling in his extremities and is moving all 4 extremities independently.  He states that his breathing is always bad so it is no worse than usual currently.  He denies chest pain, fever/chills, abdominal pain, nausea, vomiting.   Past Medical History  Diagnosis Date  . COPD (chronic obstructive pulmonary disease) (HCC)   . A-fib (HCC)   . Diabetes mellitus without complication (HCC)   . Coronary artery disease     There are no active problems to display for this patient.   History reviewed. No pertinent past surgical history.  No current outpatient prescriptions on file.  Allergies Sulfa antibiotics  History reviewed. No pertinent family history.  Social History Social History  Substance Use Topics  . Smoking status: Former Games developer  . Smokeless tobacco: None  . Alcohol Use: No    Review of Systems Constitutional: No fever/chills Eyes: No visual changes. ENT: No sore throat. Cardiovascular: Denies chest pain. Respiratory: Denies shortness of breath. Gastrointestinal: No abdominal pain.  No nausea, no vomiting.  No diarrhea.  No constipation. Genitourinary: Negative for dysuria. Musculoskeletal: Pain In head and upper part of neck Skin: Negative for rash. Neurological: Global headache, no focal weakness nor numbness  10-point ROS otherwise  negative.  ____________________________________________   PHYSICAL EXAM:  VITAL SIGNS: ED Triage Vitals  Enc Vitals Group     BP 07/04/15 1548 121/69 mmHg     Pulse Rate 07/04/15 1548 86     Resp 07/04/15 1548 18     Temp 07/04/15 1548 98.2 F (36.8 C)     Temp Source 07/04/15 1548 Oral     SpO2 07/04/15 1548 90 %     Weight 07/04/15 1548 170 lb (77.111 kg)     Height 07/04/15 1548  (1.88 m)     Head Cir --      [facility] --      Pain Score 07/04/15 1549 10     Pain Loc --      Pain Edu? --      Excl. in GC? --     Constitutional: Alert and oriented. Patient has the appearance of long-term, chronic illness Eyes: Conjunctivae are normal. PERRL. EOMI. Head: Atraumatic. Nose: No congestion/rhinnorhea. Mouth/Throat: Mucous membranes are moist.  Oropharynx non-erythematous. Neck: No stridor.  No meningeal signs.  Upper cervical spine tenderness to palpation. Cardiovascular: Normal rate, regular rhythm. Good peripheral circulation. Grossly normal heart sounds.   Respiratory: Normal respiratory effort.  No retractions. Lungs CTAB. Gastrointestinal: Soft and nontender. No distention.  Musculoskeletal: No lower extremity tenderness nor edema. No gross deformities of extremities. Neurologic:  Normal speech and language. No gross focal neurologic deficits are appreciated.  Normal grip strength bilaterally, normal strength in major muscle groups of legs.  Denies sensory deficits.  Deferred rectal exam. Skin:  Skin is warm, dry and intact. No rash noted.  Psychiatric: Mood and affect are normal. Speech and behavior are normal. In my judgment he has the capacity to make his own decisions.  ____________________________________________   LABS (all labs ordered are listed, but only abnormal results are displayed)  Labs Reviewed  GLUCOSE, CAPILLARY - Abnormal; Notable for the following:    Glucose-Capillary 249 (*)    All other components within normal limits  CBC WITH  DIFFERENTIAL/PLATELET - Abnormal; Notable for the following:    WBC 18.7 (*)    RBC 4.28 (*)    Hemoglobin 12.1 (*)    HCT 36.9 (*)    RDW 15.3 (*)    Neutro Abs 15.8 (*)    All other components within normal limits  COMPREHENSIVE METABOLIC PANEL - Abnormal; Notable for the following:    Sodium 131 (*)    Chloride 93 (*)    Glucose, Bld 249 (*)    Calcium 8.6 (*)    Albumin 3.4 (*)    All other components within normal limits  TROPONIN I - Abnormal; Notable for the following:    Troponin I 0.11 (*)    All other components within normal limits  LIPASE, BLOOD   ____________________________________________  EKG  ED ECG REPORT I, Alisson Rozell, the attending physician, personally viewed and interpreted this ECG.   Date: 07/04/2015  EKG Time: 16:05  Rate: 85  Rhythm: normal sinus rhythm  Axis: Normal  Intervals:Prolonged PR interval at 220 ms, nonspecific intraventricular conduction delay  ST&T Change: , minimal ST segment elevation in leads V1 and V2 with inverted T waves in leads 2, 3, and aVF.  Does not meet STEMI criteria but concerning for possible ischemia.   ____________________________________________  RADIOLOGY  I, Loleta Rose, personally discussed these images and results by phone with the on-call radiologist and used this discussion as part of my medical decision making.    Ct Head Wo Contrast  07/04/2015  CLINICAL DATA:  Status post slip and fall while urinating today. Blow to the head. The patient is on Plavix. Initial encounter. EXAM: CT HEAD WITHOUT CONTRAST CT CERVICAL SPINE WITHOUT CONTRAST TECHNIQUE: Multidetector CT imaging of the head and cervical spine was performed following the standard protocol without intravenous contrast. Multiplanar CT image reconstructions of the cervical spine were also generated. COMPARISON:  None. FINDINGS: CT HEAD FINDINGS There is cortical atrophy and chronic microvascular ischemic change. Scalp contusion on the left over the  frontal bone is identified without underlying fracture. No evidence of acute intracranial abnormality including hemorrhage, infarct, mass lesion, mass effect, midline shift or abnormal extra-axial fluid collection is identified. No hydrocephalus or pneumocephalus. CT CERVICAL SPINE FINDINGS The patient has a nondisplaced fracture through the lateral ring of C1 on the left. There is also a transverse fracture through the mid posterior ring of C1 on the right. Also seen is a fracture of C2 through the lateral masses just anterior to the pedicles. The fracture extends through the anterior and posterior dens. The fracture on the right is distracted 0.6 cm at the base of the pedicle and the fracture on the left shows little to no distraction at the base of the pedicle. A nondisplaced component of the fracture is seen through the anterior margin of the C2 vertebral body. Mild vertebral body height loss is seen anteriorly at T1 and T2 which could be due to fractures. No other fracture is identified. No epidural hematoma is seen. Lung apices demonstrate emphysematous change. IMPRESSION: C2 fracture is a predominantly a type 3 injury but  does extend into both lateral masses of C2 with distraction on the right of approximately 0.6 cm. Fractures of the left lateral and right posterior arches of C1. Possible mild anterior, superior endplate compression fractures of T1 and T2. Scalp hematoma without underlying fracture or acute intracranial abnormality. Atrophy and chronic microvascular ischemic change. Emphysema. Critical Value/emergent results were called by telephone at the time of interpretation on 07/04/2015 at 4:42 pm to Dr. Loleta Rose , who verbally acknowledged these results. Electronically Signed   By: Drusilla Kanner M.D.   On: 07/04/2015 16:47   Ct Cervical Spine Wo Contrast  07/04/2015  CLINICAL DATA:  Status post slip and fall while urinating today. Blow to the head. The patient is on Plavix. Initial encounter.  EXAM: CT HEAD WITHOUT CONTRAST CT CERVICAL SPINE WITHOUT CONTRAST TECHNIQUE: Multidetector CT imaging of the head and cervical spine was performed following the standard protocol without intravenous contrast. Multiplanar CT image reconstructions of the cervical spine were also generated. COMPARISON:  None. FINDINGS: CT HEAD FINDINGS There is cortical atrophy and chronic microvascular ischemic change. Scalp contusion on the left over the frontal bone is identified without underlying fracture. No evidence of acute intracranial abnormality including hemorrhage, infarct, mass lesion, mass effect, midline shift or abnormal extra-axial fluid collection is identified. No hydrocephalus or pneumocephalus. CT CERVICAL SPINE FINDINGS The patient has a nondisplaced fracture through the lateral ring of C1 on the left. There is also a transverse fracture through the mid posterior ring of C1 on the right. Also seen is a fracture of C2 through the lateral masses just anterior to the pedicles. The fracture extends through the anterior and posterior dens. The fracture on the right is distracted 0.6 cm at the base of the pedicle and the fracture on the left shows little to no distraction at the base of the pedicle. A nondisplaced component of the fracture is seen through the anterior margin of the C2 vertebral body. Mild vertebral body height loss is seen anteriorly at T1 and T2 which could be due to fractures. No other fracture is identified. No epidural hematoma is seen. Lung apices demonstrate emphysematous change. IMPRESSION: C2 fracture is a predominantly a type 3 injury but does extend into both lateral masses of C2 with distraction on the right of approximately 0.6 cm. Fractures of the left lateral and right posterior arches of C1. Possible mild anterior, superior endplate compression fractures of T1 and T2. Scalp hematoma without underlying fracture or acute intracranial abnormality. Atrophy and chronic microvascular ischemic  change. Emphysema. Critical Value/emergent results were called by telephone at the time of interpretation on 07/04/2015 at 4:42 pm to Dr. Loleta Rose , who verbally acknowledged these results. Electronically Signed   By: Drusilla Kanner M.D.   On: 07/04/2015 16:47   Dg Chest Portable 1 View  07/04/2015  CLINICAL DATA:  Hypoxia.  History of end-stage COPD.  Neck injury. EXAM: PORTABLE CHEST 1 VIEW COMPARISON:  Chest radiograph November 12, 2012 FINDINGS: The cardiac silhouette is mildly enlarged. Similar fullness of bilateral pulmonary hila. Calcified aortic knob. Status post median sternotomy. Diffuse interstitial prominence, increased from prior examination with bibasilar strandy densities. No pleural effusion or focal consolidation. No pneumothorax. Osteopenia. Soft tissue planes are normal. IMPRESSION: Mild cardiomegaly. Diffuse interstitial prominence which can be seen with atypical infection or possible interstitial lung disease with bibasilar atelectasis/ scarring. Electronically Signed   By: Awilda Metro M.D.   On: 07/04/2015 17:24    ____________________________________________   PROCEDURES  Procedure(s) performed:   .  Critical Care Performed by: Loleta Rose Authorized by: Loleta Rose Total critical care time: 60 minutes Critical care time was exclusive of separately billable procedures and treating other patients. Critical care was necessary to treat or prevent imminent or life-threatening deterioration of the following conditions: CNS failure or compromise and trauma. Critical care was time spent personally by me on the following activities: development of treatment plan with patient or surrogate, discussions with consultants, evaluation of patient's response to treatment, examination of patient, obtaining history from patient or surrogate, ordering and performing treatments and interventions, ordering and review of laboratory studies, ordering and review of radiographic studies,  pulse oximetry, re-evaluation of patient's condition and review of old charts.     ____________________________________________   INITIAL IMPRESSION / ASSESSMENT AND PLAN / ED COURSE  Pertinent labs & imaging results that were available during my care of the patient were reviewed by me and considered in my medical decision making (see chart for details).  The patient is complaining of pain in the back of his head and the top of his neck.  He is on Plavix.  We will obtain a head CT and cervical spine CT.  He became diaphoretic just before going to CT scanner, so we are checking basic labs and put him on oxygen.  He has end-stage COPD and DO NOT RESUSCITATE/DO NOT INTUBATE under the care of hospice at [facility].  ----------------------------------------- 5:12 PM on 07/04/2015 (Note that documentation was delayed due to multiple ED patients requiring immediate care.) -----------------------------------------  I received a call from radiology that the patient has multiple C1 and C2 fractures with a type III fracture of the dens.  The radiologist feels that these are definitely acute.  His labs are notable for a leukocytosis, mild hyponatremia, and a troponin of 0.11.  He does have some possible ischemic EKG changes but at no point has he had any chest pain or shortness of breath.  He is awake and alert 3.  I called and spoke with the hospice nurse familiar with his case and she let me know that he is his own decision maker regarding medical care.  He does not have a close relative who is a power of attorney.    We put a c-collar in place after receiving the results of the scan.  He will not straighten out his neck and is currently in a position of comfort so the collar is not ideally positioned but I am concerned about forcibly readjusting his neck.  He currently is moving all 4 extremities and denies any numbness or weakness in his extremities.  I told him of his injuries and explained the  severity of them.  He said that he would like to go to [facility] for evaluation.  He has been seen there before and Dr. Mellody Dance was his PCP for an extended period of time.  I have placed a call to the transfer center at [facility] to discuss emergent transfer.  I am awaiting the results of the CT scan given his chronic lung disease and his leukocytosis, but it is unknown at this time whether or not he has been on steroids (he likely is) which would explain his leukocytosis.   ----------------------------------------- 5:46 PM on 07/04/2015 -----------------------------------------  Spoke by phone with Dr. Noralee Stain at [facility] ED.  Accepted patient as ED to ED transfer.  Patient remains neurologically intact and stable.  C-collar in place although not ideally positioned.  Will transport by [facility].   ____________________________________________  FINAL CLINICAL IMPRESSION(S) / ED DIAGNOSES  Final diagnoses:  C1 cervical fracture, closed, initial encounter  C2 cervical fracture, closed, initial encounter  End stage COPD (HCC)  DNR (do not resuscitate)     MEDICATIONS GIVEN DURING THIS VISIT:  Medications - No data to display   NEW OUTPATIENT MEDICATIONS STARTED DURING THIS VISIT:  New Prescriptions   No medications on file      Note:  This document was prepared using Dragon voice recognition software and may include unintentional dictation errors.   Loleta Roseory Eileene Kisling, MD 07/04/15 1800

## 2015-07-04 NOTE — ED Notes (Addendum)
CT called regarding getting pt over to CT table, available now, RN taking pt there now.

## 2015-11-29 ENCOUNTER — Encounter (HOSPITAL_COMMUNITY): Payer: Self-pay | Admitting: Emergency Medicine

## 2015-11-29 ENCOUNTER — Inpatient Hospital Stay (HOSPITAL_COMMUNITY)
Admission: EM | Admit: 2015-11-29 | Discharge: 2015-12-17 | DRG: 296 | Disposition: E | Attending: Internal Medicine | Admitting: Internal Medicine

## 2015-11-29 ENCOUNTER — Emergency Department (HOSPITAL_COMMUNITY)

## 2015-11-29 ENCOUNTER — Inpatient Hospital Stay (HOSPITAL_COMMUNITY)

## 2015-11-29 DIAGNOSIS — I469 Cardiac arrest, cause unspecified: Secondary | ICD-10-CM | POA: Diagnosis present

## 2015-11-29 DIAGNOSIS — Z87891 Personal history of nicotine dependence: Secondary | ICD-10-CM

## 2015-11-29 DIAGNOSIS — Z882 Allergy status to sulfonamides status: Secondary | ICD-10-CM

## 2015-11-29 DIAGNOSIS — I5022 Chronic systolic (congestive) heart failure: Secondary | ICD-10-CM | POA: Diagnosis present

## 2015-11-29 DIAGNOSIS — E875 Hyperkalemia: Secondary | ICD-10-CM | POA: Diagnosis present

## 2015-11-29 DIAGNOSIS — Z9981 Dependence on supplemental oxygen: Secondary | ICD-10-CM

## 2015-11-29 DIAGNOSIS — J449 Chronic obstructive pulmonary disease, unspecified: Secondary | ICD-10-CM | POA: Diagnosis present

## 2015-11-29 DIAGNOSIS — J9600 Acute respiratory failure, unspecified whether with hypoxia or hypercapnia: Secondary | ICD-10-CM | POA: Diagnosis present

## 2015-11-29 DIAGNOSIS — I2729 Other secondary pulmonary hypertension: Secondary | ICD-10-CM | POA: Diagnosis present

## 2015-11-29 DIAGNOSIS — Z66 Do not resuscitate: Secondary | ICD-10-CM | POA: Diagnosis present

## 2015-11-29 DIAGNOSIS — R402432 Glasgow coma scale score 3-8, at arrival to emergency department: Secondary | ICD-10-CM | POA: Diagnosis present

## 2015-11-29 DIAGNOSIS — I480 Paroxysmal atrial fibrillation: Secondary | ICD-10-CM | POA: Diagnosis present

## 2015-11-29 DIAGNOSIS — I442 Atrioventricular block, complete: Secondary | ICD-10-CM | POA: Diagnosis present

## 2015-11-29 DIAGNOSIS — I2510 Atherosclerotic heart disease of native coronary artery without angina pectoris: Secondary | ICD-10-CM | POA: Diagnosis present

## 2015-11-29 DIAGNOSIS — Z6825 Body mass index (BMI) 25.0-25.9, adult: Secondary | ICD-10-CM

## 2015-11-29 DIAGNOSIS — E119 Type 2 diabetes mellitus without complications: Secondary | ICD-10-CM | POA: Diagnosis present

## 2015-11-29 DIAGNOSIS — Z951 Presence of aortocoronary bypass graft: Secondary | ICD-10-CM | POA: Diagnosis not present

## 2015-11-29 DIAGNOSIS — G931 Anoxic brain damage, not elsewhere classified: Secondary | ICD-10-CM | POA: Diagnosis present

## 2015-11-29 DIAGNOSIS — R001 Bradycardia, unspecified: Secondary | ICD-10-CM | POA: Diagnosis present

## 2015-11-29 DIAGNOSIS — E669 Obesity, unspecified: Secondary | ICD-10-CM | POA: Diagnosis present

## 2015-11-29 DIAGNOSIS — Z515 Encounter for palliative care: Secondary | ICD-10-CM | POA: Diagnosis present

## 2015-11-29 LAB — COMPREHENSIVE METABOLIC PANEL
ALK PHOS: 84 U/L (ref 38–126)
ALT: 82 U/L — AB (ref 17–63)
AST: 109 U/L — AB (ref 15–41)
Albumin: 2.9 g/dL — ABNORMAL LOW (ref 3.5–5.0)
Anion gap: 22 — ABNORMAL HIGH (ref 5–15)
BUN: 22 mg/dL — AB (ref 6–20)
CALCIUM: 7.9 mg/dL — AB (ref 8.9–10.3)
CHLORIDE: 104 mmol/L (ref 101–111)
CO2: 13 mmol/L — AB (ref 22–32)
CREATININE: 1.25 mg/dL — AB (ref 0.61–1.24)
GFR, EST NON AFRICAN AMERICAN: 54 mL/min — AB (ref >60)
Glucose, Bld: 330 mg/dL — ABNORMAL HIGH (ref 65–99)
Potassium: 5.9 mmol/L — ABNORMAL HIGH (ref 3.5–5.1)
Sodium: 139 mmol/L (ref 135–145)
Total Bilirubin: 0.3 mg/dL (ref 0.3–1.2)
Total Protein: 5.9 g/dL — ABNORMAL LOW (ref 6.5–8.1)

## 2015-11-29 LAB — CBC WITH DIFFERENTIAL/PLATELET
BASOS PCT: 1 %
Basophils Absolute: 0.3 10*3/uL — ABNORMAL HIGH (ref 0.0–0.1)
EOS ABS: 0.3 10*3/uL (ref 0.0–0.7)
EOS PCT: 1 %
HEMATOCRIT: 42.9 % (ref 39.0–52.0)
HEMOGLOBIN: 13.2 g/dL (ref 13.0–17.0)
LYMPHS PCT: 30 %
Lymphs Abs: 7.6 10*3/uL — ABNORMAL HIGH (ref 0.7–4.0)
MCH: 30 pg (ref 26.0–34.0)
MCHC: 30.8 g/dL (ref 30.0–36.0)
MCV: 97.5 fL (ref 78.0–100.0)
MONOS PCT: 4 %
Monocytes Absolute: 1 10*3/uL (ref 0.1–1.0)
NEUTROS ABS: 16 10*3/uL — AB (ref 1.7–7.7)
NEUTROS PCT: 64 %
PLATELETS: 220 10*3/uL (ref 150–400)
RBC: 4.4 MIL/uL (ref 4.22–5.81)
RDW: 14.6 % (ref 11.5–15.5)
WBC: 25.2 10*3/uL — ABNORMAL HIGH (ref 4.0–10.5)

## 2015-11-29 LAB — I-STAT VENOUS BLOOD GAS, ED
Acid-base deficit: 15 mmol/L — ABNORMAL HIGH (ref 0.0–2.0)
Bicarbonate: 17.1 mmol/L — ABNORMAL LOW (ref 20.0–28.0)
O2 Saturation: 67 %
PCO2 VEN: 70.6 mmHg — AB (ref 44.0–60.0)
PH VEN: 6.991 — AB (ref 7.250–7.430)
TCO2: 19 mmol/L (ref 0–100)
pO2, Ven: 53 mmHg — ABNORMAL HIGH (ref 32.0–45.0)

## 2015-11-29 LAB — CK TOTAL AND CKMB (NOT AT ARMC)
CK, MB: 7.6 ng/mL — ABNORMAL HIGH (ref 0.5–5.0)
RELATIVE INDEX: INVALID (ref 0.0–2.5)
Total CK: 89 U/L (ref 49–397)

## 2015-11-29 LAB — I-STAT ARTERIAL BLOOD GAS, ED
Acid-base deficit: 17 mmol/L — ABNORMAL HIGH (ref 0.0–2.0)
Bicarbonate: 15.2 mmol/L — ABNORMAL LOW (ref 20.0–28.0)
O2 Saturation: 97 %
PH ART: 6.966 — AB (ref 7.350–7.450)
TCO2: 17 mmol/L (ref 0–100)
pCO2 arterial: 66.6 mmHg (ref 32.0–48.0)
pO2, Arterial: 150 mmHg — ABNORMAL HIGH (ref 83.0–108.0)

## 2015-11-29 LAB — MAGNESIUM: MAGNESIUM: 1.6 mg/dL — AB (ref 1.7–2.4)

## 2015-11-29 LAB — TROPONIN I: TROPONIN I: 0.18 ng/mL — AB (ref <0.03)

## 2015-11-29 LAB — I-STAT CG4 LACTIC ACID, ED: Lactic Acid, Venous: 13.51 mmol/L (ref 0.5–1.9)

## 2015-11-29 LAB — LACTIC ACID, PLASMA: LACTIC ACID, VENOUS: 10.3 mmol/L — AB (ref 0.5–1.9)

## 2015-11-29 LAB — CORTISOL: CORTISOL PLASMA: 13.1 ug/dL

## 2015-11-29 LAB — I-STAT TROPONIN, ED: TROPONIN I, POC: 0.16 ng/mL — AB (ref 0.00–0.08)

## 2015-11-29 LAB — BRAIN NATRIURETIC PEPTIDE: B Natriuretic Peptide: 555.8 pg/mL — ABNORMAL HIGH (ref 0.0–100.0)

## 2015-11-29 MED ORDER — MORPHINE SULFATE 25 MG/ML IV SOLN
10.0000 mg/h | INTRAVENOUS | Status: DC
  Administered 2015-11-29: 10 mg/h via INTRAVENOUS

## 2015-11-29 MED ORDER — SODIUM CHLORIDE 0.9 % IV SOLN: 250.0000 mL | INTRAVENOUS | Status: DC | PRN

## 2015-11-29 MED ORDER — SODIUM CHLORIDE 0.9 % IV BOLUS (SEPSIS): 500.0000 mL | Freq: Once | INTRAVENOUS | Status: DC

## 2015-11-29 MED ORDER — EPINEPHRINE PF 1 MG/10ML IJ SOSY
PREFILLED_SYRINGE | INTRAMUSCULAR | Status: AC | PRN
  Administered 2015-11-29: 0.5 mg via INTRAVENOUS
  Administered 2015-11-29 (×3): 1 via INTRAVENOUS

## 2015-11-29 MED ORDER — ATROPINE SULFATE 1 MG/ML IJ SOLN
INTRAMUSCULAR | Status: AC | PRN
  Administered 2015-11-29 (×2): 1 mg via INTRAVENOUS

## 2015-11-29 MED ORDER — FAMOTIDINE IN NACL 20-0.9 MG/50ML-% IV SOLN: 20.0000 mg | Freq: Two times a day (BID) | INTRAVENOUS | Status: DC

## 2015-11-29 MED ORDER — SODIUM CHLORIDE 0.9 % IV SOLN
1.0000 mg/h | INTRAVENOUS | Status: DC
  Administered 2015-11-29: 1 mg/h via INTRAVENOUS
  Filled 2015-11-29: qty 10

## 2015-11-29 MED ORDER — SUCCINYLCHOLINE CHLORIDE 20 MG/ML IJ SOLN
INTRAMUSCULAR | Status: AC | PRN
  Administered 2015-11-29: 150 mg via INTRAVENOUS

## 2015-11-29 MED ORDER — MAGNESIUM SULFATE 2 GM/50ML IV SOLN: 2.0000 g | Freq: Once | INTRAVENOUS | Status: DC

## 2015-11-29 MED ORDER — HEPARIN SODIUM (PORCINE) 5000 UNIT/ML IJ SOLN: 5000.0000 [IU] | Freq: Three times a day (TID) | INTRAMUSCULAR | Status: DC

## 2015-11-29 MED ORDER — LORAZEPAM BOLUS VIA INFUSION
2.0000 mg | INTRAVENOUS | Status: DC | PRN
  Filled 2015-11-29: qty 5

## 2015-11-29 MED ORDER — DEXTROSE 5 % IV SOLN
5.0000 mg/h | INTRAVENOUS | Status: DC
  Administered 2015-11-29: 5 mg/h via INTRAVENOUS
  Filled 2015-11-29: qty 25

## 2015-11-29 MED ORDER — ASPIRIN 81 MG PO CHEW: 324.0000 mg | CHEWABLE_TABLET | Freq: Once | ORAL | Status: DC

## 2015-11-29 MED ORDER — EPINEPHRINE PF 1 MG/ML IJ SOLN
0.5000 ug/min | INTRAMUSCULAR | Status: DC
  Administered 2015-11-29: 20 ug/min via INTRAVENOUS
  Filled 2015-11-29: qty 4

## 2015-11-29 MED ORDER — MORPHINE BOLUS VIA INFUSION
5.0000 mg | INTRAVENOUS | Status: DC | PRN
  Administered 2015-11-29: 10 mg via INTRAVENOUS
  Administered 2015-11-29: 5 mg via INTRAVENOUS
  Filled 2015-11-29: qty 20

## 2015-11-29 MED ORDER — DOPAMINE-DEXTROSE 3.2-5 MG/ML-% IV SOLN
5.0000 ug/kg/min | INTRAVENOUS | Status: DC
  Administered 2015-11-29: 5 ug/kg/min via INTRAVENOUS

## 2015-11-29 MED ORDER — SODIUM CHLORIDE 0.9 % IV SOLN: INTRAVENOUS | Status: DC

## 2015-11-29 MED ORDER — INSULIN ASPART 100 UNIT/ML ~~LOC~~ SOLN: 2.0000 [IU] | SUBCUTANEOUS | Status: DC

## 2015-11-29 MED ORDER — IPRATROPIUM-ALBUTEROL 0.5-2.5 (3) MG/3ML IN SOLN: 3.0000 mL | Freq: Four times a day (QID) | RESPIRATORY_TRACT | Status: DC

## 2015-11-29 MED ORDER — ASPIRIN 300 MG RE SUPP: 300.0000 mg | RECTAL | Status: DC

## 2015-11-29 MED ORDER — ASPIRIN 81 MG PO CHEW: 324.0000 mg | CHEWABLE_TABLET | ORAL | Status: DC

## 2015-11-29 MED ORDER — ETOMIDATE 2 MG/ML IV SOLN
INTRAVENOUS | Status: AC | PRN
  Administered 2015-11-29: 20 mg via INTRAVENOUS

## 2015-11-30 MED FILL — Medication: Qty: 1 | Status: AC

## 2015-12-04 LAB — CULTURE, BLOOD (ROUTINE X 2)
CULTURE: NO GROWTH
Culture: NO GROWTH

## 2015-12-17 NOTE — ED Notes (Signed)
PT being paced at this time .

## 2015-12-17 NOTE — ED Notes (Signed)
Respiratory at bedside to extubate pt.

## 2015-12-17 NOTE — ED Notes (Addendum)
Wife is now at bedside with MD. Wife wishes to begin end of life care in ED. Morphine drip to be started and then will end medications and extubate pt. 2H and patient made aware.

## 2015-12-17 NOTE — ED Notes (Signed)
Pt continues to be paced at this time. Femoral pulse found with doppler.

## 2015-12-17 NOTE — ED Provider Notes (Signed)
[facility]-EMERGENCY DEPT Provider Note   CSN: [id number] Arrival date & time: 05-29-2015  1513     History   Chief Complaint Chief Complaint  Patient presents with  . Cardiac Arrest    HPI Antony SalmonRonald J Setters is a 75 y.o. male.  Patient presents after a witnessed arrest while at the [employer] office. Received bystander CPR for around 10 minutes prior to EMS arrival. Initial rhythm was PEA. He received four rounds of epniephrine, ROSC achieved just prior to arrival in the Ed. Patient's girlfriend said he felt fine this morning. History of COPD and CAD though with no recent hospitalizations.    The history is provided by the EMS personnel and a friend. No language interpreter was used.  Cardiac Arrest  Witnessed by:  Nunzio CobbsBystander Incident location: Social Security office. Time since incident:  40 minutes Time before BLS initiated:  1-2 minutes Time before ALS initiated:  8-10 minutes Condition upon EMS arrival:  Agonal respirations Pulse:  Absent Initial cardiac rhythm per EMS:  PEA Treatments prior to arrival:  ACLS protocol and vascular access Medications given prior to ED:  Epinephrine IV access type:  Intraosseous Airway: Brooke DareKing. Rhythm on admission to ED:  PEA Risk factors: COPD and heart problem     Past Medical History:  Diagnosis Date  . A-fib (HCC)   . COPD (chronic obstructive pulmonary disease) (HCC)   . Coronary artery disease   . Diabetes mellitus without complication [facility](HCC)     Patient Active Problem List   Diagnosis Date Noted  . Cardiac arrest (HCC) 11/05/2015    History reviewed. No pertinent surgical history.     Home Medications    Prior to Admission medications   Not on File    Family History No family history on file.  Social History Social History  Substance Use Topics  . Smoking status: Former Games developermoker  . Smokeless tobacco: Not on file  . Alcohol use No     Allergies   Sulfa antibiotics   Review of Systems Review of Systems    Unable to perform ROS: Patient unresponsive     Physical Exam Updated Vital Signs BP 140/60   Pulse (!) 0   Temp (!) 96.4 F (35.8 C) (Rectal)   Resp (!) 0   Ht 5\' 8"  (1.727 m)   Wt 77 kg   SpO2 94%   BMI 25.81 kg/m   Physical Exam  Constitutional: He appears toxic. He appears ill. He is intubated. Backboard in place.  Agonal respirations  Eyes:  Pupils 3 mm and equal, minimally reactive  Cardiovascular:  Absent pulses on arrival  Pulmonary/Chest: He is intubated.  Abdominal:  Soft, non distended  Neurological: He is unresponsive. GCS eye subscore is 1. GCS verbal subscore is 1. GCS motor subscore is 1.  Skin:  Cool to touch, not diaphoretic.      ED Treatments / Results  Labs (all labs ordered are listed, but only abnormal results are displayed) Labs Reviewed  CBC WITH DIFFERENTIAL/PLATELET - Abnormal; Notable for the following:       Result Value   WBC 25.2 (*)    Neutro Abs 16.0 (*)    Lymphs Abs 7.6 (*)    Basophils Absolute 0.3 (*)    All other components within normal limits  COMPREHENSIVE METABOLIC PANEL - Abnormal; Notable for the following:    Potassium 5.9 (*)    CO2 13 (*)    Glucose, Bld 330 (*)    BUN 22 (*)  Creatinine, Ser 1.25 (*)    Calcium 7.9 (*)    Total Protein 5.9 (*)    Albumin 2.9 (*)    AST 109 (*)    ALT 82 (*)    GFR calc non Af Amer 54 (*)    Anion gap 22 (*)    All other components within normal limits  TROPONIN I - Abnormal; Notable for the following:    Troponin I 0.18 (*)    All other components within normal limits  BRAIN NATRIURETIC PEPTIDE - Abnormal; Notable for the following:    B Natriuretic Peptide 555.8 (*)    All other components within normal limits  CK TOTAL AND CKMB (NOT AT [facility]) - Abnormal; Notable for the following:    CK, MB 7.6 (*)    All other components within normal limits  MAGNESIUM - Abnormal; Notable for the following:    Magnesium 1.6 (*)    All other components within normal limits   LACTIC ACID, PLASMA - Abnormal; Notable for the following:    Lactic Acid, Venous 10.3 (*)    All other components within normal limits  I-STAT ARTERIAL BLOOD GAS, ED - Abnormal; Notable for the following:    pH, Arterial 6.966 (*)    pCO2 arterial 66.6 (*)    pO2, Arterial 150.0 (*)    Bicarbonate 15.2 (*)    Acid-base deficit 17.0 (*)    All other components within normal limits  I-STAT CG4 LACTIC ACID, ED - Abnormal; Notable for the following:    Lactic Acid, Venous 13.51 (*)    All other components within normal limits  I-STAT TROPOININ, ED - Abnormal; Notable for the following:    Troponin i, poc 0.16 (*)    All other components within normal limits  I-STAT VENOUS BLOOD GAS, ED - Abnormal; Notable for the following:    pH, Ven 6.991 (*)    pCO2, Ven 70.6 (*)    pO2, Ven 53.0 (*)    Bicarbonate 17.1 (*)    Acid-base deficit 15.0 (*)    All other components within normal limits  CULTURE, BLOOD (ROUTINE X 2)  CULTURE, BLOOD (ROUTINE X 2)  CORTISOL  BLOOD GAS, ARTERIAL  URINALYSIS, ROUTINE W REFLEX MICROSCOPIC (NOT AT [facility])  BLOOD GAS, ARTERIAL  BLOOD GAS, VENOUS  CBC  BASIC METABOLIC PANEL  BLOOD GAS, ARTERIAL    EKG  EKG Interpretation None       Radiology Dg Chest Portable 1 View  Result Date: Dec 22, 2015 CLINICAL DATA:  Status post central line placement. EXAM: PORTABLE CHEST 1 VIEW COMPARISON:  Dec 22, 2015 FINDINGS: Right jugular central line has been placed. Catheter tip in the lower SVC region. Negative for a pneumothorax. Endotracheal tube is 5.7 cm above the carina. Prominent interstitial lung markings are unchanged. Cardiac pads overlying the chest. Median sternotomy wires are noted. Evidence for nasogastric tube extending into the abdomen. Heart size is stable and within normal limits. IMPRESSION: Central line tip in the lower SVC.  Negative for pneumothorax. Prominent lung markings may represent mild edema. Findings are similar to the previous examination.  Electronically Signed   By: Richarda OverlieAdam  Henn M.D.   On: Dec 22, 2015 17:01   Dg Chest Portable 1 View  Result Date: Dec 22, 2015 CLINICAL DATA:  Intubation, post CPR. History of COPD, diabetes and atrial fibrillation. EXAM: PORTABLE CHEST 1 VIEW COMPARISON:  Chest radiograph July 04, 2015 FINDINGS: Endotracheal tube tip projects 2.9 cm above the carina. Nasogastric tube past GE junction, lateral course of the  esophagus. Cardiac silhouette is mildly enlarged. Calcified aortic knob. Diffuse interstitial prominence and pulmonary vascular congestion without pleural effusion or focal consolidation the lung bases incompletely imaged. No pneumothorax. Age indeterminate suspected RIGHT fifth and sixth lateral rib fractures old LEFT posterior rib fractures. Osteopenia. Status post median sternotomy for CABG. Multiple EKG lines in pacer pads overlie patient. IMPRESSION: Endotracheal tube tip projects 2.9 cm above the carina. Nasogastric tube past GE junction. Mild cardiomegaly. Interstitial prominence most compatible with pulmonary edema. Age-indeterminate RIGHT lateral rib fractures. Electronically Signed   By: Awilda Metro M.D.   On: Dec 17, 2015 16:00    Procedures Procedure Name: Intubation Date/Time: 2015-12-17 6:51 PM Performed by: Preston Fleeting Pre-anesthesia Checklist: Timeout performed, Suction available, Emergency Drugs available and Patient being monitored Oxygen Delivery Method: Ambu bag Preoxygenation: Pre-oxygenation with 100% oxygen Intubation Type: Rapid sequence Ventilation: Mask ventilation without difficulty Laryngoscope Size: Glidescope Grade View: Grade I Tube type: Subglottic suction tube Tube size: 7.5 mm Number of attempts: 1 Airway Equipment and Method: Video-laryngoscopy Placement Confirmation: ETT inserted through vocal cords under direct vision,  Positive ETCO2 and Breath sounds checked- equal and bilateral Secured at: 23 cm Tube secured with: ETT holder Dental Injury: Teeth and  Oropharynx as per pre-operative assessment     .Central Line Date/Time: 2015-12-17 6:52 PM Performed by: Preston Fleeting Authorized by: Preston Fleeting   Consent:    Consent obtained:  Emergent situation Pre-procedure details:    Hand hygiene: Hand hygiene performed prior to insertion     Sterile barrier technique: All elements of maximal sterile technique followed     Skin preparation:  2% chlorhexidine and povidone-iodine   Skin preparation agent: Skin preparation agent completely dried prior to procedure   Anesthesia (see MAR for exact dosages):    Anesthesia method:  None Procedure details:    Location:  R internal jugular   Site selection rationale:  Cordis for transvenous pacing   Patient position:  Reverse Trendelenburg   Procedural supplies:  Triple lumen and Cordis   Landmarks identified: yes     Ultrasound guidance: yes     Sterile ultrasound techniques: Sterile gel and sterile probe covers were used     Number of attempts:  1   Successful placement: yes   Post-procedure details:    Post-procedure:  Dressing applied and line sutured   Assessment:  Blood return through all ports, no pneumothorax on x-ray, placement verified by x-ray and free fluid flow   Patient tolerance of procedure:  Tolerated well, no immediate complications Comments:     Placement in vein confirmed by VBG       EMERGENCY DEPARTMENT [place] CARDIAC EXAM "Study: Limited Ultrasound of the heart and pericardium"  INDICATIONS:Cardiac arrest Multiple views of the heart and pericardium are obtained with a multi-frequency probe.  PERFORMED ZO:[id number]  IMAGES ARCHIVED?: Yes  FINDINGS: No pericardial effusion, Decreased contractility and Tamponade physiology absent  LIMITATIONS:  Emergent procedure  VIEWS USED: Subcostal 4 chamber and Parasternal long axis  INTERPRETATION: Cardiac activity present, Pericardial effusioin absent, Cardiac tamponade absent and Decreased contractility  COMMENT:  Mildly  increased RV size   (including critical care time)  Medications Ordered in ED Medications  EPINEPHrine (ADRENALIN) 4 mg in dextrose 5 % 250 mL (0.016 mg/mL) infusion (0 mcg/min Intravenous Stopped 2015-12-17 1758)  aspirin chewable tablet 324 mg (not administered)  0.9 %  sodium chloride infusion (not administered)  heparin injection 5,000 Units (not administered)  sodium chloride 0.9 % bolus 500 mL (500 mLs Intravenous  Rate/Dose Change 2015-12-18 1629)  0.9 %  sodium chloride infusion ( Intravenous Rate/Dose Change 2015/12/18 1630)  famotidine (PEPCID) IVPB 20 mg premix (not administered)  DOPamine (INTROPIN) 800 mg in dextrose 5 % 250 mL (3.2 mg/mL) infusion (0 mcg/kg/min  77.1 kg Intravenous Stopped 12-18-2015 1758)  ipratropium-albuterol (DUONEB) 0.5-2.5 (3) MG/3ML nebulizer solution 3 mL (not administered)  insulin aspart (novoLOG) injection 2-6 Units (not administered)  LORazepam (ATIVAN) bolus via infusion 2-5 mg (not administered)  LORazepam (ATIVAN) 50 mg in dextrose 5 % 50 mL (1 mg/mL) infusion (5 mg/hr Intravenous New Bag/Given 2015-12-18 1811)  morphine bolus via infusion 5-20 mg (10 mg Intravenous Bolus from Bag 2015-12-18 1810)  morphine 250 mg in sodium chloride 0.9 % 250 mL (1 mg/mL) infusion (10 mg/hr Intravenous Bolus Dec 18, 2015 1809)  EPINEPHrine (ADRENALIN) 1 MG/10ML injection (1 Syringe Intravenous Given 12/18/15 1553)  atropine injection (1 mg Intravenous Given December 18, 2015 1543)  etomidate (AMIDATE) injection (20 mg Intravenous Given 12-18-15 1526)  succinylcholine (ANECTINE) injection (150 mg Intravenous Given December 18, 2015 1526)     Initial Impression / Assessment and Plan / ED Course  I have reviewed the triage vital signs and the nursing notes.  Pertinent labs & imaging results that were available during my care of the patient were reviewed by me and considered in my medical decision making (see chart for details).  Clinical Course     Patient presents after a witnessed  arrest by bystanders, found to be in PEA by EMS. Received epinephrine x 4 and initially had ROSC briefly prior to arrival. On arrival here he initially had no pulses so ACLS re-initiated. ROSC achieved after several minutes and he subsequently became bradycardic and required transcutaneous pacing and several atropine doses. Prior to this, [facility] airway exchanged for endotracheal tube. He required a second round of CPR around 45 minutes after arrival and ROSC was achieved again after epinephrine and atropine. He was started on an epinephrine drip, ultimately a dopamine drip, and was transcutaneously paced to a rate of 90. Right IJ Cordis placed with the ultimate goal of having transvenous pacer access if necessary, though he was not started on transvenous pacing. Bedside ultrasound with no effusion, grossly poor squeeze, slightly dilated RV.  EKG ultimately revealed third degree block and ischemia but no obvious STEMI. Likely etiology of arrest is MI given new onset third degree block and arrest, so cardiology consulted and evaluated patient in the ED. Critical care was also consulted. Patient was set to be admitted to the ICU though on discussion with his girlfriend, the decision was made to withdraw care and make patient comfort care based on previously expressed wishes that he would wish to be DNR. He was started on a morphine drip for comfort. Epinephrine and dopamine drips withdrawn and he was compassionately extubated with his girlfriend at the bedside. Time of death called at 6:29.  Final Clinical Impressions(s) / ED Diagnoses   Final diagnoses:  Cardiac arrest [facility])  Third degree heart block [facility])    New Prescriptions New Prescriptions   No medications on file     Preston Fleeting, MD 2015-12-18 2135    Courteney Lyn Mackuen, MD 12/03/15 1435

## 2015-12-17 NOTE — ED Triage Notes (Signed)
[facility] , CPR started by bystanders at 1438 , ems arrived at 1440 pt received 4 epi by EMS

## 2015-12-17 NOTE — Progress Notes (Signed)
CSW has tried to reach pt's family without success.  Number on facesheet does not work.  Message left with pt's former NH, [employer], re: [employer] information.

## 2015-12-17 NOTE — ED Notes (Signed)
PT EXTUBATED

## 2015-12-17 NOTE — ED Notes (Signed)
MD mackuen states cental line is in correct place and may use.

## 2015-12-17 NOTE — H&P (Signed)
PULMONARY / CRITICAL CARE MEDICINE   Name: Craig SalmonRonald J Kolasa MRN: [id number] DOB: July 03, 1940    ADMISSION DATE:  December 30, 2015 CONSULTATION DATE:  11/13  REFERRING MD:  Dr. Excell Seltzerooper Cardiology  CHIEF COMPLAINT:  Cardiac arrest  HISTORY OF PRESENT ILLNESS:   75 year old male with PMH of PAF, COPD, CAD, DM. He unfortunately suffered a cardiac arrest 11/13 while out in public. He was witnessed to collapse and then got bystander CPR started about 5 minutes later. Upon EMS arrival he was not in a shockable rhythm and received 4 additional rounds of CPR before regaining pulses. He did code 2 additional times in the ED for total cardiac arrest time estimated at about 45 mins. No obvious acute ischemic changes on ECG so cardiology decided not to intervene. He was, however, in complete heart block requiring transcutaneous pacing. He was started on high doses of epinephrine and dopamine for shock and bradycardia. Laboratory evaluation significant for K 5.9, CO2 13, Scr 1.25 (baseline 0.6), Troponin 0.16, Lactic Acid 13.51, WBC 25.2.  PAST MEDICAL HISTORY :  He  has a past medical history of A-fib (HCC); COPD (chronic obstructive pulmonary disease) (HCC); Coronary artery disease; and Diabetes mellitus without complication (HCC).  PAST SURGICAL HISTORY: He  has no past surgical history on file.  Allergies  Allergen Reactions  . Sulfa Antibiotics     No current facility-administered medications on file prior to encounter.    No current outpatient prescriptions on file prior to encounter.    FAMILY HISTORY:  His has no family status information on file.    SOCIAL HISTORY: He  reports that he has quit smoking. He does not have any smokeless tobacco history on file. He reports that he does not drink alcohol.  REVIEW OF SYSTEMS:   Unable  SUBJECTIVE:  unable  VITAL SIGNS: BP 108/71   Pulse 88   Resp 20   Ht 5\' 8"  (1.727 m)   Wt 77.1 kg (169 lb 15.6 oz) Comment: on 07/04/15  SpO2 100%   BMI  25.84 kg/m   HEMODYNAMICS:    VENTILATOR SETTINGS: Vent Mode: PRVC FiO2 (%):  [100 %] 100 % Set Rate:  [14 bmp-18 bmp] 18 bmp Vt Set:  [500 mL-550 mL] 550 mL PEEP:  [5 cmH20-10 cmH20] 10 cmH20 Plateau Pressure:  [15 cmH20] 15 cmH20  INTAKE / OUTPUT: No intake/output data recorded.  PHYSICAL EXAMINATION: General:  Male of normal body habitus on vent Neuro:  Comatose. GCS 3T HEENT:  [place]/AT, no JVD Cardiovascular:  CHB on EKG, now paced at 90 bpm Lungs:  Bilateral crackles Abdomen:  Soft, non-tender, non-distended Musculoskeletal:  No acute deformity Skin:  Grossly intact  LABS:  BMET No results for input(s): NA, K, CL, CO2, BUN, CREATININE, GLUCOSE in the last 168 hours.  Electrolytes No results for input(s): CALCIUM, MG, PHOS in the last 168 hours.  CBC  Recent Labs Lab 2015-02-04 1558  WBC 25.2*  HGB 13.2  HCT 42.9  PLT 220    Coag's No results for input(s): APTT, INR in the last 168 hours.  Sepsis Markers  Recent Labs Lab 2015-02-04 1612  LATICACIDVEN 13.51*    ABG  Recent Labs Lab 2015-02-04 1611  PHART 6.966*  PCO2ART 66.6*  PO2ART 150.0*    Liver Enzymes No results for input(s): AST, ALT, ALKPHOS, BILITOT, ALBUMIN in the last 168 hours.  Cardiac Enzymes No results for input(s): TROPONINI, PROBNP in the last 168 hours.  Glucose No results for input(s): GLUCAP in  the last 168 hours.  Imaging Dg Chest Portable 1 View  Result Date: 12/01/2015 CLINICAL DATA:  Intubation, post CPR. History of COPD, diabetes and atrial fibrillation. EXAM: PORTABLE CHEST 1 VIEW COMPARISON:  Chest radiograph July 04, 2015 FINDINGS: Endotracheal tube tip projects 2.9 cm above the carina. Nasogastric tube past GE junction, lateral course of the esophagus. Cardiac silhouette is mildly enlarged. Calcified aortic knob. Diffuse interstitial prominence and pulmonary vascular congestion without pleural effusion or focal consolidation the lung bases incompletely imaged. No  pneumothorax. Age indeterminate suspected RIGHT fifth and sixth lateral rib fractures old LEFT posterior rib fractures. Osteopenia. Status post median sternotomy for CABG. Multiple EKG lines in pacer pads overlie patient. IMPRESSION: Endotracheal tube tip projects 2.9 cm above the carina. Nasogastric tube past GE junction. Mild cardiomegaly. Interstitial prominence most compatible with pulmonary edema. Age-indeterminate RIGHT lateral rib fractures. Electronically Signed   By: Awilda Metro M.D.   On: 2015/12/01 16:00     STUDIES:    CULTURES:   ANTIBIOTICS:   SIGNIFICANT EVENTS: 11/13 admit after cardiac arrest  LINES/TUBES: ETT 11/13 > CVL RIJ 11/13 >  DISCUSSION: 75 year old male suffered prolonged PEA/asystolic cardiac arrest. Very poor neuro exam and poor prognosis. Cardiology not offering intervention. Not a candidate for cooling. Will be conservative with his care moving forward. Avoid fevers, continue pressors, and will need to address goals of care with family.   ASSESSMENT / PLAN:  PULMONARY A: Acute hypercarbic respiratory failure s/p cardiac arrest COPD without acute exacerbation  P:    Full vent support Follow ABG PCXR VAP bundle Scheduled nebs  CARDIOVASCULAR A:  Cardiac arrest - 45 mins PEA/asystole CHB Cardiogenic shock  P:  Tele MAP goal 65 Trend trop, lactic Epi, dopa for shock/brady No plan for transvenous pacer due to poor prognosis Not a candidate for TTM therapy Avoid fevers Cardiology following. Little to offer.   RENAL A:   AKI Hyperkalemia Hypomag  P:   Follow BMP K Should improve with vent and correction of acidosis Replete Mag  GASTROINTESTINAL A:   No acute issues  P:   NPO Pepcid for SUP  HEMATOLOGIC A:   No acute issues  P:  Follow CBC Heparin for VTE ppx  INFECTIOUS A:   Leukocytosis, likely acute phase reactant.   P:   Follow WBC and fever curves Defer ABX for now  ENDOCRINE A:   DM  P:    CBG monitoring and SSI  NEUROLOGIC A:   Acute anoxic encephalopathy   P:   RASS goal: 0 Poor prognosis, unlikely to recover based on current exam and hisotry Will need to discuss goals of care with family.    FAMILY  - Updates: No family available  - Inter-disciplinary family meet or Palliative Care meeting due by:  11/13   Joneen Roach, AGACNP-BC [place] [facility] Pager [phone] or [phone]  2015/12/01 5:21 PM   Update: Wife now at bedside requesting comfort care. She states that her husband has been DNR for some time now and has been struggling with a lot of medical problems over that time period. She wishes for "all the tubes" to be removed and to allow him to pass peacefully.   Joneen Roach, AGACNP-BC [place] [facility]/Critical Care Pager [phone] or [phone]  12-01-2015 5:39 PM   STAFF NOTE: Cindi Carbon, MD FACP have personally reviewed patient's available data, including medical history, events of note, physical examination and test results as part of my evaluation. I have  discussed with resident/NP and other care providers such as pharmacist, RN and RRT. In addition, I personally evaluated patient and elicited key findings of: in ed unresponsive to pain, perr, ronchi, obese, nt, no r/g, , EKG with some wife complex, he coded totoal 40 min pea, has significant cad, chf, s/p cabg, concern is primary cardiac event and now MODs, servere anoxia, he is now in CHB, and shock, epi started,. Add dopamine at beta affect in hopes thet epi can be dc'ed, pacing and captuing on transdermal pacing, increase rate 90, he is not a candidate for ttm and would not change outcome, cards consulted, not candidate for cath or pacer wire, abg reviewed, increase MV /rate and repeat abg, may need peep increase, also has sig lung dz, may need some permissive hypercapnia, lactic acid noted - poor predcitive outcome, no family at bedside, will need to  discuss comfort care with them when they arrive The patient is critically ill with multiple organ systems failure and requires high complexity decision making for assessment and support, frequent evaluation and titration of therapies, application of advanced monitoring technologies and extensive interpretation of multiple databases.   Critical Care Time devoted to patient care services described in this note is 45 Minutes. This time reflects time of care of this signee: Rory Percyaniel Emonee Winkowski, MD FACP. This critical care time does not reflect procedure time, or teaching time or supervisory time of PA/NP/Med student/Med Resident etc but could involve care discussion time. Rest per NP/medical resident whose note is outlined above and that I agree with   Mcarthur Rossettianiel J. Tyson AliasFeinstein, MD, FACP Pgr: [phone] [place] [facility] August 04, 2015 9:25 PM

## 2015-12-17 NOTE — Discharge Summary (Signed)
75 year old male with PMH of PAF, COPD, CAD, DM. He unfortunately suffered a cardiac arrest 11/13 while out in public. He was witnessed to collapse and then got bystander CPR started about 5 minutes later. Upon EMS arrival he was not in a shockable rhythm and received 4 additional rounds of CPR before regaining pulses. He did code 2 additional times in the ED for total cardiac arrest time estimated at about 45 mins. No obvious acute ischemic changes on ECG so cardiology decided not to intervene. He was, however, in complete heart block requiring transcutaneous pacing. He was started on high doses of epinephrine and dopamine for shock and bradycardia. Laboratory evaluation significant for K 5.9, CO2 13, Scr 1.25 (baseline 0.6), Troponin 0.16, Lactic Acid 13.51, WBC 25.2.   personally evaluated patient and elicited key findings of: in ed unresponsive to pain, perr, ronchi, obese, nt, no r/g, , EKG with some wife complex, he coded totoal 40 min pea, has significant cad, chf, s/p cabg, concern is primary cardiac event and now MODs, servere anoxia, he is now in CHB, and shock, epi started,. Add dopamine at beta affect in hopes thet epi can be dc'ed, pacing and captuing on transdermal pacing, increase rate 90, he is not a candidate for ttm and would not change outcome, cards consulted, not candidate for cath or pacer wire, abg reviewed, increase MV /rate and repeat abg, may need peep increase, also has sig lung dz, may need some permissive hypercapnia, lactic acid noted - poor predcitive outcome, no family at bedside, will need to discuss comfort care with them when they arrive  Wife  Arrived, wished comfort and died  Final Diagnosis upon death  1. Severe anoxic brain injury 2. Cardiac ischemia 3. MODs 4. Complete heart block 5. Shock, cardiogenic 6. COPD with acute resp failure  Mcarthur Rossettianiel J. Tyson AliasFeinstein, MD, FACP Pgr: [phone] [facility]

## 2015-12-17 NOTE — ED Notes (Signed)
Pt's girlfriend of 17 years is at bedside.

## 2015-12-17 NOTE — Code Documentation (Signed)
Epi gtt increased to 20mcg/min 

## 2015-12-17 NOTE — Procedures (Signed)
Intubation Procedure Note Craig SalmonRonald J Nash [id number] 1940/12/31  Procedure: Intubation Indications: Airway protection and maintenance  Procedure Details Consent: Risks of procedure as well as the alternatives and risks of each were explained to the (patient/caregiver).  Consent for procedure obtained. Time Out: Verified patient identification, verified procedure, site/side was marked, verified correct patient position, special equipment/implants available, medications/allergies/relevent history reviewed, required imaging and test results available.  Performed  Maximum sterile technique was used including gloves.  MAC    Evaluation Hemodynamic Status: BP stable throughout; O2 sats: stable throughout Patient's Current Condition: stable Complications: No apparent complications Patient did tolerate procedure well. Chest X-ray ordered to verify placement.  CXR: pending.   Craig Nash, Craig Nash December 25, 2015

## 2015-12-17 NOTE — ED Notes (Signed)
Resident at bedside setting up central line.

## 2015-12-17 NOTE — Code Documentation (Signed)
EPI GTT started at 9 mcg/min

## 2015-12-17 NOTE — Progress Notes (Signed)
Pt terminally extubated per doctor. Pt placed on 2 L [place]. Family member by Pt's side.. Pt is agonally breathing

## 2015-12-17 NOTE — ED Notes (Signed)
Morphine and Ativan drip wasted in sink with Chrislyn RN

## 2015-12-17 NOTE — ED Notes (Signed)
Monitor turned on comfort care and pacer turned off per MD.

## 2015-12-17 NOTE — Procedures (Signed)
Extubation Procedure Note  Patient Details:   Name: Craig SalmonRonald J Allmon DOB: Nov 07, 1940 MRN: [id number]   Airway Documentation:  Airway (Active)  Secured at (cm) 22 cm 08-Aug-2015  4:16 PM  Measured From Teeth 08-Aug-2015  4:16 PM  Secured Location Center 08-Aug-2015  4:16 PM  Secured By [employer] Tube Holder 08-Aug-2015  4:16 PM  Site Condition Dry 08-Aug-2015  3:30 PM    Evaluation  O2 sats: stable throughout Complications: No apparent complications Patient did tolerate procedure well. Bilateral Breath Sounds: Clear   No  Katheren Shamsowell, Osaze Hubbert Farmer 08-Aug-2015, 6:12 PM

## 2015-12-17 NOTE — Consult Note (Signed)
CARDIOLOGY CONSULT NOTE  Patient ID: Craig Nash, MRN: [id number], DOB/AGE: Aug 05, 1940 75 y.o. Admit date: 2016-01-01 Date of Consult: 2016-01-01  Primary Physician: No primary care provider on file. [facility] Dr Mellody DanceKizer Referring Physician: Dr Corlis LeakMackuen  Chief Complaint: Cardiac Arrest  Reason for Consultation: Cardiac Arrest  HPI: 75 yo male with hx of CAD and chronic systolic heart failure presents with sudden loss of consciousness [facility] [employer] [facility]. He was treated for PEA arrest with CPR and ACLS. ROSC obtained with medications and CPR. He then developed complete heart block and required transcutaneous pacing intermittently. He is now on an epinephrine gtt.  All history obtained from chart review. [facility]. He has O2 dependent COPD and chronic systolic heart failure with 4 chamber cardiac dilatation. Echo in Jan 2017 showed LVEF 20-25%, moderate TR, moderate-severe pulmonary HTN.  Medical History:  Past Medical History:  Diagnosis Date  . A-fib (HCC)   . COPD (chronic obstructive pulmonary disease) (HCC)   . Coronary artery disease   . Diabetes mellitus without complication [facility](HCC)       Surgical History: History reviewed. No pertinent surgical history.   Home Meds: Prior to Admission medications   Not on File    Inpatient Medications:  . aspirin  324 mg Oral Once   . epinephrine      Allergies:  Allergies  Allergen Reactions  . Sulfa Antibiotics     Social History   Social History  . Marital status: Divorced    Spouse name: N/A  . Number of children: N/A  . Years of education: N/A   Occupational History  . Not on file.   Social History Main Topics  . Smoking status: Former Games developermoker  . Smokeless tobacco: Not on file  . Alcohol use No  . Drug use: Unknown  . Sexual activity: Not on file   Other Topics Concern  . Not on file   Social History Narrative  . No narrative on file      Family Hx: unable to obtain  Review of Systems: Unable to obtain  Physical Exam: BP 180/110, HR 90, R 20 Pt is intubated, unresponsive. HEENT: normal Neck: JVP elevated Lungs: equal expansion, clear bilaterally, decreased air movement CV: Apex is discrete and nondisplaced, RRR without murmur or gallop, distant heart sounds Abd: soft, +BS Back: no midline Ext: no C/C/E Skin: warm and dry without rash Neuro: unresponsive    Labs: No results for input(s): CKTOTAL, CKMB, TROPONINI in the last 72 hours. Lab Results  Component Value Date   WBC 18.7 (H) 07/04/2015   HGB 12.1 (L) 07/04/2015   HCT 36.9 (L) 07/04/2015   MCV 86.2 07/04/2015   PLT 261 07/04/2015   No results for input(s): NA, K, CL, CO2, BUN, CREATININE, CALCIUM, PROT, BILITOT, ALKPHOS, ALT, AST, GLUCOSE in the last 168 hours.  Invalid input(s): LABALBU No results found for: CHOL, HDL, LDLCALC, TRIG No results found for: DDIMER  Radiology/Studies:  Dg Chest Portable 1 View  Result Date: 2016-01-01 CLINICAL DATA:  Intubation, post CPR. History of COPD, diabetes and atrial fibrillation. EXAM: PORTABLE CHEST 1 VIEW COMPARISON:  Chest radiograph July 04, 2015 FINDINGS: Endotracheal tube tip projects 2.9 cm above the carina. Nasogastric tube past GE junction, lateral course of the esophagus. Cardiac silhouette is mildly enlarged. Calcified aortic knob. Diffuse interstitial prominence and pulmonary vascular congestion without pleural effusion or focal consolidation the lung bases incompletely imaged. No  pneumothorax. Age indeterminate suspected RIGHT fifth and sixth lateral rib fractures old LEFT posterior rib fractures. Osteopenia. Status post median sternotomy for CABG. Multiple EKG lines in pacer pads overlie patient. IMPRESSION: Endotracheal tube tip projects 2.9 cm above the carina. Nasogastric tube past GE junction. Mild cardiomegaly. Interstitial prominence most compatible with pulmonary edema. Age-indeterminate RIGHT  lateral rib fractures. Electronically Signed   By: Awilda Metroourtnay  Bloomer M.D.   On: November 21, 2015 16:00    EKG: 2:1 AV block with RBBB  Cardiac Studies: 2D Echo 01/2015:  Dilated left ventricle - moderate  Left ventricular hypertrophy - mild  Severely decreased left ventricular systolic function, ejection fraction  20 to 25%  Dilated left atrium - mild  Dilated right ventricle - severe  Right ventricular hypertrophy - moderate to severe  Reduced right ventricular systolic function with evidence of RV volume  overload  Tricuspid regurgitation - moderate  Dilated right atrium - moderate  Technically difficult study due to chest wall/lung interference  Elevated pulmonary artery systolic pressure - moderate to severe  ASSESSMENT AND PLAN:  1. Out-of-hospital PEA arrest   ROSC with ACLS/epi gtt  Intermittent transcutaneous pacing  No evidence of STEMI  2. Severe ischemic cardiomyopathy with 4 chamber cardiac dilatation and severe LV/RV dysfunction on most recent echo  3. CAD s/p CABG  4. Severe O2 Dependent COPD  Pt evaluated in the ER. Discussed with Dr Corlis LeakMackuen and Dr Tyson AliasFeinstein. Poor prognosis in setting of advanced age, severe underlying cardiac and pulmonary disease, and PEA arrest scenario. I would not recommend proceeding with invasive evaluation and treatment of this patient's cardiac condition as I think it is highly unlikely there will be treatable CAD based on my review of his most recent cath assessment. In addition, he has biventricular heart failure at baseline and home O2 dependence, making his in-hospital mortality extremely high in the setting of PEA arrest. Will follow with you and discuss goals of care with family when they are available.   SignedTonny Nash, Craig Subramaniam MD, [facility] November 21, 2015, 4:13 PM

## 2015-12-17 DEATH — deceased

## 2017-06-18 IMAGING — DX DG CHEST 1V PORT
1 series · 1 of 1 positions shown · non-contrast
Comparison: Chest radiograph November 12, 2012

CLINICAL DATA: Hypoxia.  History of end-stage COPD.  Neck injury.

EXAM:
PORTABLE CHEST 1 VIEW

[chest ap]
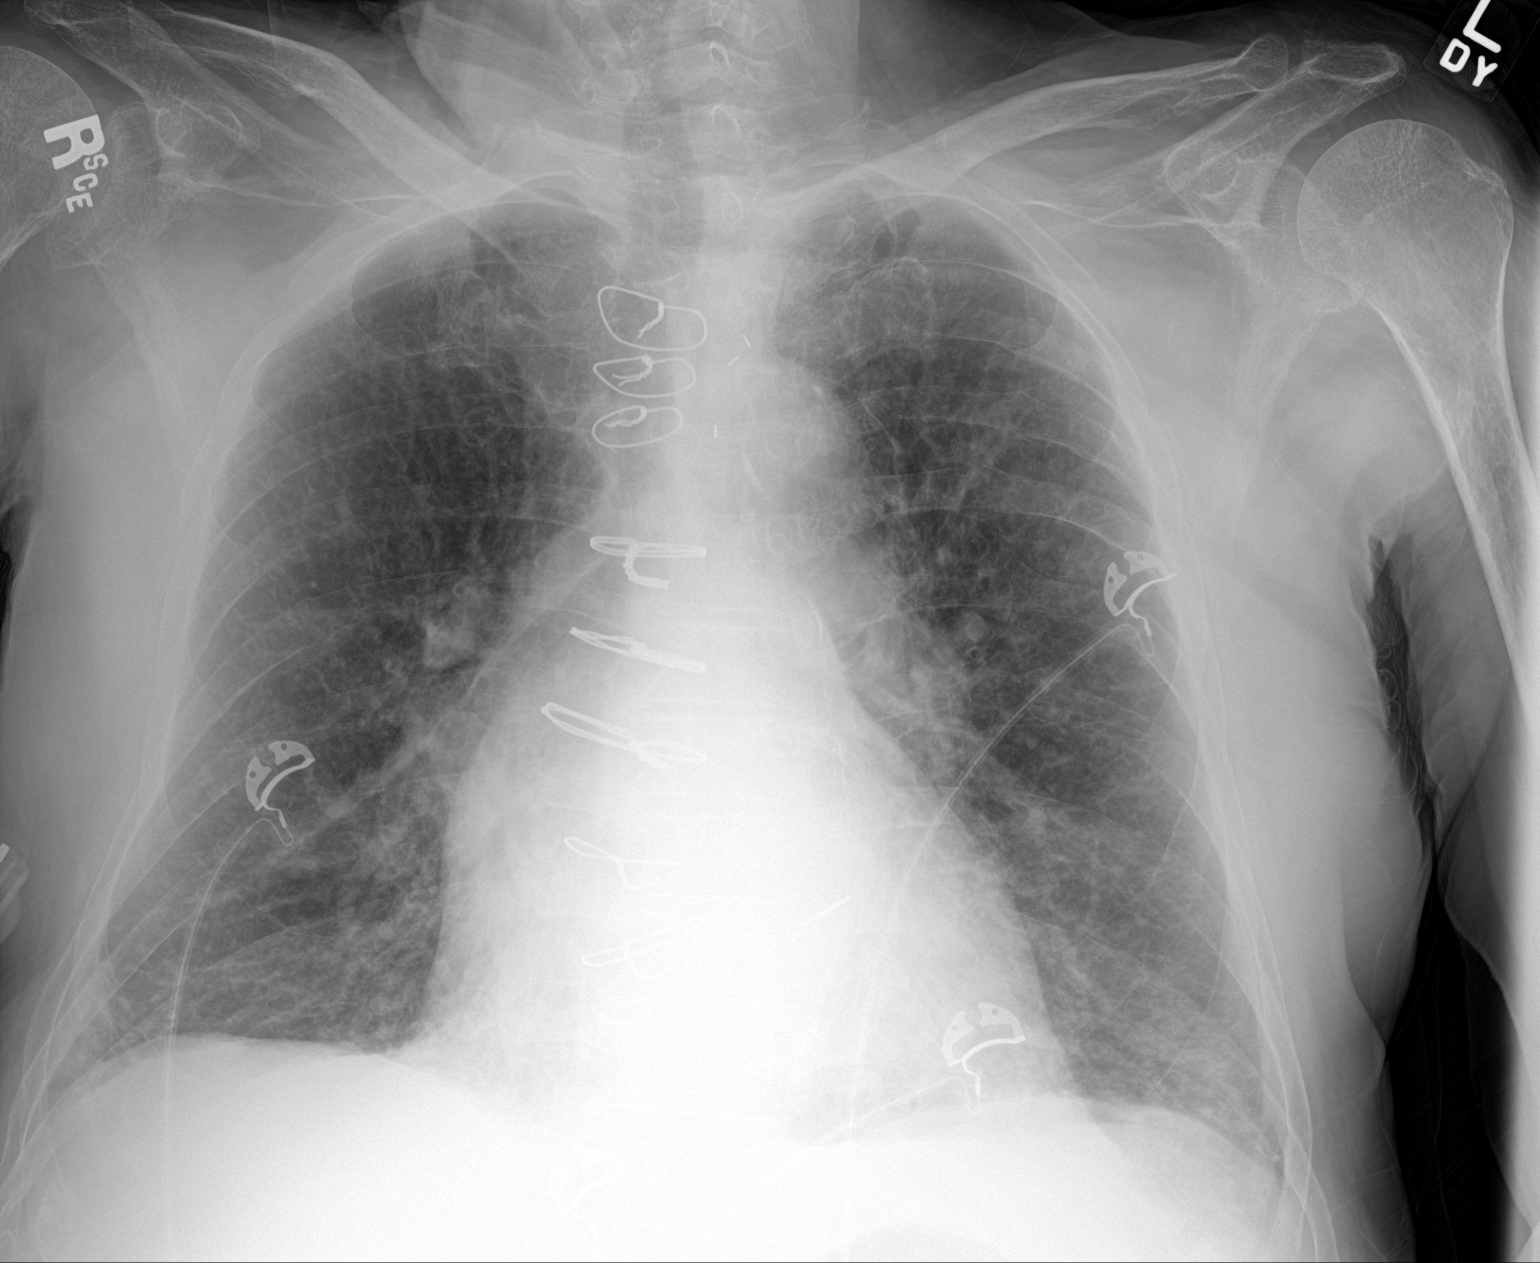

[1 of 1 positions shown; findings below may reference images not displayed]

FINDINGS: The cardiac silhouette is mildly enlarged. Similar fullness of
bilateral pulmonary hila. Calcified aortic knob. Status post median
sternotomy. Diffuse interstitial prominence, increased from prior
examination with bibasilar strandy densities. No pleural effusion or
focal consolidation. No pneumothorax. Osteopenia. Soft tissue planes
are normal.
IMPRESSION: Mild cardiomegaly. Diffuse interstitial prominence which can be seen
with atypical infection or possible interstitial lung disease with
bibasilar atelectasis/ scarring.
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
